# Patient Record
Sex: Female | Born: 1945 | Race: White | Hispanic: No | Marital: Married | Smoking: Never smoker
Health system: Southern US, Community
[De-identification: ages and names within clinical notes are randomized; demographics above are authoritative.]

## PROBLEM LIST (undated history)

## (undated) DIAGNOSIS — Z85528 Personal history of other malignant neoplasm of kidney: Secondary | ICD-10-CM

## (undated) DIAGNOSIS — K439 Ventral hernia without obstruction or gangrene: Secondary | ICD-10-CM

## (undated) DIAGNOSIS — C4491 Basal cell carcinoma of skin, unspecified: Secondary | ICD-10-CM

## (undated) DIAGNOSIS — I471 Supraventricular tachycardia, unspecified: Secondary | ICD-10-CM

## (undated) DIAGNOSIS — N281 Cyst of kidney, acquired: Secondary | ICD-10-CM

## (undated) DIAGNOSIS — I10 Essential (primary) hypertension: Secondary | ICD-10-CM

## (undated) DIAGNOSIS — I9789 Other postprocedural complications and disorders of the circulatory system, not elsewhere classified: Secondary | ICD-10-CM

## (undated) DIAGNOSIS — C439 Malignant melanoma of skin, unspecified: Secondary | ICD-10-CM

## (undated) DIAGNOSIS — Q625 Duplication of ureter: Secondary | ICD-10-CM

## (undated) DIAGNOSIS — I4891 Unspecified atrial fibrillation: Secondary | ICD-10-CM

## (undated) DIAGNOSIS — Z85820 Personal history of malignant melanoma of skin: Secondary | ICD-10-CM

## (undated) DIAGNOSIS — Z860100 Personal history of colon polyps, unspecified: Secondary | ICD-10-CM

## (undated) DIAGNOSIS — K219 Gastro-esophageal reflux disease without esophagitis: Secondary | ICD-10-CM

## (undated) DIAGNOSIS — E119 Type 2 diabetes mellitus without complications: Secondary | ICD-10-CM

## (undated) DIAGNOSIS — E785 Hyperlipidemia, unspecified: Secondary | ICD-10-CM

## (undated) DIAGNOSIS — C661 Malignant neoplasm of right ureter: Secondary | ICD-10-CM

## (undated) DIAGNOSIS — Z8719 Personal history of other diseases of the digestive system: Secondary | ICD-10-CM

## (undated) DIAGNOSIS — D3A8 Other benign neuroendocrine tumors: Secondary | ICD-10-CM

## (undated) DIAGNOSIS — K449 Diaphragmatic hernia without obstruction or gangrene: Secondary | ICD-10-CM

## (undated) DIAGNOSIS — Z8679 Personal history of other diseases of the circulatory system: Secondary | ICD-10-CM

## (undated) DIAGNOSIS — Z9889 Other specified postprocedural states: Secondary | ICD-10-CM

## (undated) DIAGNOSIS — Z85828 Personal history of other malignant neoplasm of skin: Secondary | ICD-10-CM

## (undated) DIAGNOSIS — Z8589 Personal history of malignant neoplasm of other organs and systems: Secondary | ICD-10-CM

## (undated) HISTORY — PX: COLONOSCOPY: SHX174

## (undated) HISTORY — PX: PANCREATICODUODENECTOMY: SUR1000

## (undated) HISTORY — DX: Supraventricular tachycardia, unspecified: I47.10

## (undated) HISTORY — DX: Other benign neuroendocrine tumors: D3A.8

## (undated) HISTORY — DX: Malignant neoplasm of right ureter: C66.1

## (undated) HISTORY — DX: Basal cell carcinoma of skin, unspecified: C44.91

## (undated) HISTORY — DX: Personal history of other malignant neoplasm of kidney: Z85.528

## (undated) HISTORY — DX: Supraventricular tachycardia: I47.1

## (undated) HISTORY — DX: Unspecified atrial fibrillation: I48.91

## (undated) HISTORY — DX: Type 2 diabetes mellitus without complications: E11.9

## (undated) HISTORY — DX: Malignant melanoma of skin, unspecified: C43.9

## (undated) HISTORY — DX: Other postprocedural complications and disorders of the circulatory system, not elsewhere classified: I97.89

## (undated) HISTORY — DX: Essential (primary) hypertension: I10

## (undated) HISTORY — PX: KIDNEY SURGERY: SHX687

---

## 1987-08-20 HISTORY — PX: MELANOMA EXCISION: SHX5266

## 1998-08-19 HISTORY — PX: LUMBAR DISC SURGERY: SHX700

## 1998-10-24 ENCOUNTER — Observation Stay (HOSPITAL_COMMUNITY): Admission: RE | Admit: 1998-10-24 | Discharge: 1998-10-24 | Payer: Self-pay | Admitting: Neurosurgery

## 1998-10-24 ENCOUNTER — Encounter: Payer: Self-pay | Admitting: Neurosurgery

## 1999-03-28 ENCOUNTER — Other Ambulatory Visit: Admission: RE | Admit: 1999-03-28 | Discharge: 1999-03-28 | Payer: Self-pay | Admitting: Obstetrics and Gynecology

## 1999-05-08 ENCOUNTER — Other Ambulatory Visit: Admission: RE | Admit: 1999-05-08 | Discharge: 1999-05-08 | Payer: Self-pay | Admitting: Obstetrics and Gynecology

## 1999-05-08 ENCOUNTER — Encounter (INDEPENDENT_AMBULATORY_CARE_PROVIDER_SITE_OTHER): Payer: Self-pay | Admitting: Specialist

## 2000-06-27 ENCOUNTER — Other Ambulatory Visit: Admission: RE | Admit: 2000-06-27 | Discharge: 2000-06-27 | Payer: Self-pay | Admitting: Obstetrics and Gynecology

## 2000-06-27 ENCOUNTER — Encounter (INDEPENDENT_AMBULATORY_CARE_PROVIDER_SITE_OTHER): Payer: Self-pay

## 2003-02-09 ENCOUNTER — Ambulatory Visit (HOSPITAL_COMMUNITY): Admission: RE | Admit: 2003-02-09 | Discharge: 2003-02-09 | Payer: Self-pay | Admitting: Gastroenterology

## 2004-06-14 ENCOUNTER — Other Ambulatory Visit: Admission: RE | Admit: 2004-06-14 | Discharge: 2004-06-14 | Payer: Self-pay | Admitting: Obstetrics and Gynecology

## 2005-02-01 ENCOUNTER — Ambulatory Visit (HOSPITAL_COMMUNITY): Admission: RE | Admit: 2005-02-01 | Discharge: 2005-02-01 | Payer: Self-pay | Admitting: Orthopedic Surgery

## 2005-02-01 ENCOUNTER — Ambulatory Visit (HOSPITAL_BASED_OUTPATIENT_CLINIC_OR_DEPARTMENT_OTHER): Admission: RE | Admit: 2005-02-01 | Discharge: 2005-02-01 | Payer: Self-pay | Admitting: Orthopedic Surgery

## 2005-02-01 HISTORY — PX: OTHER SURGICAL HISTORY: SHX169

## 2005-07-04 ENCOUNTER — Other Ambulatory Visit: Admission: RE | Admit: 2005-07-04 | Discharge: 2005-07-04 | Payer: Self-pay | Admitting: Obstetrics and Gynecology

## 2006-07-07 ENCOUNTER — Other Ambulatory Visit: Admission: RE | Admit: 2006-07-07 | Discharge: 2006-07-07 | Payer: Self-pay | Admitting: Obstetrics and Gynecology

## 2007-07-13 ENCOUNTER — Other Ambulatory Visit: Admission: RE | Admit: 2007-07-13 | Discharge: 2007-07-13 | Payer: Self-pay | Admitting: Obstetrics and Gynecology

## 2008-08-19 HISTORY — PX: CATARACT EXTRACTION W/ INTRAOCULAR LENS  IMPLANT, BILATERAL: SHX1307

## 2008-09-22 ENCOUNTER — Other Ambulatory Visit: Admission: RE | Admit: 2008-09-22 | Discharge: 2008-09-22 | Payer: Self-pay | Admitting: Obstetrics and Gynecology

## 2009-03-22 ENCOUNTER — Encounter: Admission: RE | Admit: 2009-03-22 | Discharge: 2009-03-22 | Payer: Self-pay | Admitting: Family Medicine

## 2009-09-25 ENCOUNTER — Other Ambulatory Visit: Admission: RE | Admit: 2009-09-25 | Discharge: 2009-09-25 | Payer: Self-pay | Admitting: Obstetrics and Gynecology

## 2011-01-04 NOTE — Op Note (Signed)
   NAMEKAIDA, GAMES NO.:  [id number]   MEDICAL RECORD NO.:  [id number]                   PATIENT TYPE:  AMB   LOCATION:  ENDO                                 FACILITY:  [facility]   PHYSICIAN:  Graylin Shiver, M.D.                DATE OF BIRTH:  [birth year]   DATE OF PROCEDURE:  02/09/2003  DATE OF DISCHARGE:                                 OPERATIVE REPORT   PROCEDURE:  Colonoscopy.   SURGEON:  Graylin Shiver, M.D.   INDICATIONS:  Family history of colon cancer.   INFORMED CONSENT:  Informed consent was obtained.   PREMEDICATION:  Fentanyl 70 mcg IV, Versed 7 mg IV.   DESCRIPTION OF PROCEDURE:  With the patient in the left lateral decubitus  position, a rectal exam was performed, and no masses were felt.  The Olympus  colonoscope was inserted into the rectum and advanced around a very tortuous  colon the cecum.  Cecal landmarks were identified.  The cecum and ascending  colon were normal.  The transverse colon was normal.  The descending colon,  sigmoid and rectum were normal.  She tolerated the procedure well without  complications.   IMPRESSION:  Normal colonoscopy to the cecum.   PLAN:  I would recommend a followup colonoscopy in five years.                                               Graylin Shiver, M.D.    Germain Osgood  D:  02/09/2003  T:  02/10/2003  Job:  [id number]   cc:   C. Duane Lope, M.D.  [address]  [place]  [place] [address]  Fax: [phone]

## 2011-01-04 NOTE — Op Note (Signed)
NAMECARALEE, Jennifer Wall              ACCOUNT NO.:  [id number]   MEDICAL RECORD NO.:  [id number]          PATIENT TYPE:  AMB   LOCATION:  DSC                          FACILITY:  [facility]   PHYSICIAN:  Katy Fitch. Sypher, M.D. DATE OF BIRTH:  [birth year]   DATE OF PROCEDURE:  02/01/2005  DATE OF DISCHARGE:                                 OPERATIVE REPORT   PREOP DIAGNOSIS:  Locking right thumb stenosing tenosynovitis.   POSTOP DIAGNOSIS:  Locking right thumb stenosing tenosynovitis.   OPERATIONS:  Release of right thumb A1 pulley.   OPERATING SURGEON:  Josephine Igo, M.D.   ASSISTANT:  Molly Maduro Dasnoit PA-C.   ANESTHESIA:  Marcaine 0.25% and 2% lidocaine. Metacarpal head level block of  right thumb supplemented by IV sedation. Supervising anesthesiologist Dr.  Gelene Mink.   INDICATIONS:  Jennifer Wall is a 65 year old right-hand dominant woman who  presented for evaluation of a locking right thumb. Physical examination  demonstrated a swollen right thumb A1 pulley and active locking of the IP  joint.   Due to a failed respond to nonoperative measures, she is brought to the  operating room at this time for release of a right thumb A1 pulley.   PROCEDURE IN DETAIL:  [name] is brought to the operating room and  placed in the supine position on the operating table.   Following light sedation, the right arm was prepped with Betadine soap  solution and sterilely draped. A pneumatic tourniquet was applied to the  proximal brachium.   Following exsanguination of the right arm with an Esmarch bandage, the  arterial tourniquet was inflated to 220 mmHg. Marcaine 0.25% and 2%  lidocaine were infiltrated at the metacarpal head level to obtain a digital  block. When anesthesia was satisfactory, a short transverse incision was  fashioned directly over the swollen A1 pulley. The subcutaneous tissues were  carefully divided taking care to identify the radial proper digital nerve.  This was gently  retracted. The pulley was isolated and split with scalpel  and scissors. Thereafter, full range of motion of the IP joint was  recovered.   The wound was repaired with mattress sutures of 5-0 nylon.  There were no  apparent complications.   For aftercare, [name] is provided Darvocet-N 100, one p.o. q.4-6 hours  p.r.n. pain. She will return to our office for followup in one week.       RVS/MEDQ  D:  02/01/2005  T:  02/01/2005  Job:  [id number]

## 2011-10-16 ENCOUNTER — Other Ambulatory Visit: Payer: Self-pay | Admitting: Dermatology

## 2011-11-29 ENCOUNTER — Other Ambulatory Visit: Payer: Self-pay | Admitting: Dermatology

## 2015-02-21 ENCOUNTER — Other Ambulatory Visit: Payer: Self-pay | Admitting: Gastroenterology

## 2015-02-21 ENCOUNTER — Encounter (HOSPITAL_COMMUNITY): Payer: Self-pay | Admitting: *Deleted

## 2015-02-22 ENCOUNTER — Ambulatory Visit (HOSPITAL_COMMUNITY): Payer: Medicare Other | Admitting: Anesthesiology

## 2015-02-22 ENCOUNTER — Ambulatory Visit (HOSPITAL_COMMUNITY)
Admission: RE | Admit: 2015-02-22 | Discharge: 2015-02-22 | Disposition: A | Discharge status: Home / Self Care | Payer: Medicare Other | Source: Ambulatory Visit | Attending: Gastroenterology | Admitting: Gastroenterology

## 2015-02-22 ENCOUNTER — Encounter (HOSPITAL_COMMUNITY): Payer: Self-pay | Admitting: Anesthesiology

## 2015-02-22 ENCOUNTER — Encounter (HOSPITAL_COMMUNITY)
Admission: RE | Disposition: A | Discharge status: Home / Self Care | Payer: Self-pay | Source: Ambulatory Visit | Attending: Gastroenterology

## 2015-02-22 DIAGNOSIS — K219 Gastro-esophageal reflux disease without esophagitis: Secondary | ICD-10-CM | POA: Insufficient documentation

## 2015-02-22 DIAGNOSIS — K868 Other specified diseases of pancreas: Secondary | ICD-10-CM | POA: Diagnosis present

## 2015-02-22 DIAGNOSIS — Z79899 Other long term (current) drug therapy: Secondary | ICD-10-CM | POA: Insufficient documentation

## 2015-02-22 DIAGNOSIS — I10 Essential (primary) hypertension: Secondary | ICD-10-CM | POA: Diagnosis not present

## 2015-02-22 HISTORY — DX: Gastro-esophageal reflux disease without esophagitis: K21.9

## 2015-02-22 HISTORY — PX: EUS: SHX5427

## 2015-02-22 HISTORY — DX: Essential (primary) hypertension: I10

## 2015-02-22 HISTORY — DX: Hyperlipidemia, unspecified: E78.5

## 2015-02-22 SURGERY — UPPER ENDOSCOPIC ULTRASOUND (EUS) RADIAL
Anesthesia: Monitor Anesthesia Care

## 2015-02-22 MED ORDER — LIDOCAINE HCL (CARDIAC) 20 MG/ML IV SOLN
INTRAVENOUS | Status: AC
Start: 2015-02-22 — End: 2015-02-22
  Filled 2015-02-22: qty 5

## 2015-02-22 MED ORDER — PROPOFOL INFUSION 10 MG/ML OPTIME
INTRAVENOUS | Status: DC | PRN
  Administered 2015-02-22: 2000 ug/kg/min via INTRAVENOUS

## 2015-02-22 MED ORDER — PROPOFOL INFUSION 10 MG/ML OPTIME
INTRAVENOUS | Status: DC | PRN
  Administered 2015-02-22: 90 mL via INTRAVENOUS

## 2015-02-22 MED ORDER — LACTATED RINGERS IV SOLN
INTRAVENOUS | Status: DC
  Administered 2015-02-22: 10:00:00 via INTRAVENOUS
  Administered 2015-02-22: 1000 mL via INTRAVENOUS

## 2015-02-22 MED ORDER — PROPOFOL 10 MG/ML IV BOLUS
INTRAVENOUS | Status: AC
  Filled 2015-02-22: qty 60

## 2015-02-22 MED ORDER — SODIUM CHLORIDE 0.9 % IV SOLN: INTRAVENOUS | Status: DC

## 2015-02-22 NOTE — Anesthesia Postprocedure Evaluation (Signed)
  Anesthesia Post-op Note  Patient: Jennifer Wall  Procedure(s) Performed: Procedure(s) (LRB): UPPER ENDOSCOPIC ULTRASOUND (EUS) RADIAL (N/A)  Patient Location: PACU  Anesthesia Type: MAC  Level of Consciousness: awake and alert   Airway and Oxygen Therapy: Patient Spontanous Breathing  Post-op Pain: mild  Post-op Assessment: Post-op Vital signs reviewed, Patient's Cardiovascular Status Stable, Respiratory Function Stable, Patent Airway and No signs of Nausea or vomiting  Last Vitals:  Filed Vitals:   02/22/15 1110  BP: 175/73  Pulse: 44  Temp:   Resp: 19    Post-op Vital Signs: stable   Complications: No apparent anesthesia complications

## 2015-02-22 NOTE — Anesthesia Preprocedure Evaluation (Addendum)
Anesthesia Evaluation  Patient identified by MRN, date of birth, ID band Patient awake    Reviewed: Allergy & Precautions, NPO status , Patient's Chart, lab work & pertinent test results  Airway Mallampati: II  TM Distance: >3 FB Neck ROM: Full    Dental no notable dental hx.    Pulmonary neg pulmonary ROS,  breath sounds clear to auscultation  Pulmonary exam normal       Cardiovascular Exercise Tolerance: Good hypertension, Pt. on medications Normal cardiovascular exam+ Valvular Problems/Murmurs Rhythm:Regular Rate:Normal     Neuro/Psych negative neurological ROS  negative psych ROS   GI/Hepatic Neg liver ROS, GERD-  Controlled,  Endo/Other  negative endocrine ROS  Renal/GU negative Renal ROS  negative genitourinary   Musculoskeletal negative musculoskeletal ROS (+)   Abdominal   Peds negative pediatric ROS (+)  Hematology negative hematology ROS (+)   Anesthesia Other Findings   Reproductive/Obstetrics negative OB ROS                            Anesthesia Physical Anesthesia Plan  ASA: II  Anesthesia Plan: MAC   Post-op Pain Management:    Induction: Intravenous  Airway Management Planned: Natural Airway  Additional Equipment:   Intra-op Plan:   Post-operative Plan:   Informed Consent: I have reviewed the patients History and Physical, chart, labs and discussed the procedure including the risks, benefits and alternatives for the proposed anesthesia with the patient or authorized representative who has indicated his/her understanding and acceptance.   Dental advisory given  Plan Discussed with: CRNA  Anesthesia Plan Comments:         Anesthesia Quick Evaluation

## 2015-02-22 NOTE — Discharge Instructions (Signed)
Endoscopic Ultrasound ° °Care After °Please read the instructions outlined below and refer to this sheet in the next few weeks. These discharge instructions provide you with general information on caring for yourself after you leave the hospital. Your doctor may also give you specific instructions. While your treatment has been planned according to the most current medical practices available, unavoidable complications occasionally occur. If you have any problems or questions after discharge, please call Dr. Kirston Luty ([facility]) at [phone]. ° °HOME CARE INSTRUCTIONS °Activity °· You may resume your regular activity but move at a slower pace for the next 24 hours.  °· Take frequent rest periods for the next 24 hours.  °· Walking will help expel (get rid of) the air and reduce the bloated feeling in your abdomen.  °· No driving for 24 hours (because of the anesthesia (medicine) used during the test).  °· You may shower.  °· Do not sign any important legal documents or operate any machinery for 24 hours (because of the anesthesia used during the test).  °Nutrition °· Drink plenty of fluids.  °· You may resume your normal diet.  °· Begin with a light meal and progress to your normal diet.  °· Avoid alcoholic beverages for 24 hours or as instructed by your caregiver.  °Medications °You may resume your normal medications unless your caregiver tells you otherwise. °What you can expect today °· You may experience abdominal discomfort such as a feeling of fullness or "gas" pains.  °· You may experience a sore throat for 2 to 3 days. This is normal. Gargling with salt water may help this.  °·  °SEEK IMMEDIATE MEDICAL CARE IF: °· You have excessive nausea (feeling sick to your stomach) and/or vomiting.  °· You have severe abdominal pain and distention (swelling).  °· You have trouble swallowing.  °· You have a temperature over 100° F (37.8° C).  °· You have rectal bleeding or vomiting of blood.  °Document  Released: 03/19/2004 Document Revised: 04/17/2011 Document Reviewed: 09/30/2007 °ExitCare® Patient Information ©2012 [employer]. °

## 2015-02-22 NOTE — Op Note (Signed)
[facility] [place], [id number]   UPPER ENDOSCOPIC ULTRASOUND PROCEDURE REPORT     EXAM DATE: 02/22/2015  PATIENT NAME:          Jennifer Wall, Jennifer Wall          MR#: [id number] BIRTHDATE:       1945-12-27     VISIT #:     [id number] ATTENDING:     Arta Silence, MD     STATUS:     outpatient ASSISTANT:      Sharon Mt and Carolynn Comment  REFERRING MD:  Melinda Crutch, M.D.  Acquanetta Sit, M.D. ASA CLASS:        Class II  INDICATIONS:  The patient is a 69 yr old female here for a lower endoscopic ultrasound due to abnormal CT scan; lesion in head of pancreas. PROCEDURE PERFORMED:     Upper Endoscopic Ultrasound with FNA   MEDICATIONS:     Monitored anesthesia care (MAC) via CRNA   CONSENT: The patient understands the risks and benefits of the procedure and understands that these risks include, but are not limited to: sedation, allergic reaction, infection, perforation and/or bleeding. Alternative means of evaluation and treatment include, among others: physical exam, x-rays, and/or surgical intervention. The patient elects to proceed with this endoscopic procedure.  DESCRIPTION OF PROCEDURE: During intra-op preparation period all mechanical & medical equipment was checked for proper function. Hand hygiene and appropriate measures for infection prevention was taken. After the risks, benefits and alternatives of the procedure were thoroughly explained, Informed consent was verified, confirmed and timeout was successfully executed by the treatment team. The patient was then placed in the left, lateral, decubitus position and IV sedation was administered. Throughout the procedure, the patients blood pressure, pulse and oxygen saturations were monitored continuously. Under direct visualization, the linear oblique-viewing echoendoscope was introduced through the mouth and advanced to the bulb of duodenum.   Round well-defined hypoechoic 15 mm x  20 mm lesion seen in head of pancreas; lesion is in close proximity to, and seems to abut (in part) but not invade, the portal vein along a 67mm distance.  No perilesional adenopathy.  No involvement noted in celiac artery or SMA vasculature.  Lesion was biopsied x 4 (25g needle), 3 passes for slides and 1 pass in toto for cell block.     ADVERSE EVENTS:     None IMPRESSIONS:     As above.  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  RECOMMENDATIONS:     1.  Watch for potential complications of procedure. 2.  Await cytology results. 3.  If neuroendocrine lesion is confirmed, would obtain surgical consultation.  ___________________________________ Arta Silence, MD eSigned:  Arta Silence, MD 02/22/2015 10:46 AM   cc:

## 2015-02-22 NOTE — H&P (Signed)
Patient interval history reviewed.  Patient examined again.  There has been no change from documented H/P dated 02/16/15 (scanned into chart from our office) except as documented above.  Assessment:  1.  Pancreatic head lesion. 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  Plan:  1.  Endoscopic ultrasound with possible biopsies (fine needle aspiration = FNA). 2.  Risks (bleeding, infection, bowel perforation that could require surgery, sedation-related changes in cardiopulmonary systems), benefits (identification and possible treatment of source of symptoms, exclusion of certain causes of symptoms), and alternatives (watchful waiting, radiographic imaging studies, empiric medical treatment) of upper endoscopy with ultrasound and possible biopsies (EUS +/- FNA) were explained to patient/family in detail and patient wishes to proceed.

## 2015-02-22 NOTE — Transfer of Care (Signed)
Immediate Anesthesia Transfer of Care Note  Patient: Jennifer Wall  Procedure(s) Performed: Procedure(s): UPPER ENDOSCOPIC ULTRASOUND (EUS) RADIAL (N/A)  Patient Location: PACU  Anesthesia Type:MAC  Level of Consciousness: awake, alert  and oriented  Airway & Oxygen Therapy: Patient Spontanous Breathing and Patient connected to nasal cannula oxygen  Post-op Assessment: Report given to RN and Post -op Vital signs reviewed and stable  Post vital signs: Reviewed and stable  Last Vitals:  Filed Vitals:   02/22/15 0937  BP: 170/71  Pulse: 57  Temp: 36.6 C  Resp: 15    Complications: No apparent anesthesia complications

## 2015-02-22 NOTE — Addendum Note (Signed)
Addended by: Sederick Jacobsen on: 02/22/2015 08:41 AM   Modules accepted: Orders  

## 2015-02-23 ENCOUNTER — Encounter (HOSPITAL_COMMUNITY): Payer: Self-pay | Admitting: Gastroenterology

## 2015-03-20 DIAGNOSIS — D3A8 Other benign neuroendocrine tumors: Secondary | ICD-10-CM

## 2015-03-20 HISTORY — DX: Other benign neuroendocrine tumors: D3A.8

## 2015-03-23 DIAGNOSIS — I119 Hypertensive heart disease without heart failure: Secondary | ICD-10-CM | POA: Insufficient documentation

## 2015-03-23 DIAGNOSIS — K219 Gastro-esophageal reflux disease without esophagitis: Secondary | ICD-10-CM | POA: Insufficient documentation

## 2015-03-23 DIAGNOSIS — I10 Essential (primary) hypertension: Secondary | ICD-10-CM

## 2015-03-23 HISTORY — DX: Essential (primary) hypertension: I10

## 2015-04-14 HISTORY — PX: DIAGNOSTIC LAPAROSCOPY: SUR761

## 2015-04-17 DIAGNOSIS — C4491 Basal cell carcinoma of skin, unspecified: Secondary | ICD-10-CM

## 2015-04-17 DIAGNOSIS — C439 Malignant melanoma of skin, unspecified: Secondary | ICD-10-CM

## 2015-04-17 HISTORY — DX: Malignant melanoma of skin, unspecified: C43.9

## 2015-04-17 HISTORY — DX: Basal cell carcinoma of skin, unspecified: C44.91

## 2015-05-22 DIAGNOSIS — I480 Paroxysmal atrial fibrillation: Secondary | ICD-10-CM | POA: Insufficient documentation

## 2015-05-22 DIAGNOSIS — I4891 Unspecified atrial fibrillation: Secondary | ICD-10-CM

## 2015-05-22 HISTORY — DX: Unspecified atrial fibrillation: I48.91

## 2015-06-12 DIAGNOSIS — I471 Supraventricular tachycardia, unspecified: Secondary | ICD-10-CM

## 2015-06-12 HISTORY — DX: Supraventricular tachycardia: I47.1

## 2015-06-12 HISTORY — DX: Supraventricular tachycardia, unspecified: I47.10

## 2015-09-13 ENCOUNTER — Other Ambulatory Visit: Payer: Self-pay | Admitting: Urology

## 2015-09-15 ENCOUNTER — Encounter (HOSPITAL_BASED_OUTPATIENT_CLINIC_OR_DEPARTMENT_OTHER): Payer: Self-pay | Admitting: *Deleted

## 2015-09-18 ENCOUNTER — Encounter (HOSPITAL_BASED_OUTPATIENT_CLINIC_OR_DEPARTMENT_OTHER): Payer: Self-pay | Admitting: *Deleted

## 2015-09-18 NOTE — Progress Notes (Signed)
NPO AFTER MN.  ARRIVE AT 0745.  NEEDS ISTAT AND EKG.  WILL TAKE TOPROL AND PROTONIX AM DOS W/ SIPS OF WATER.

## 2015-09-21 NOTE — Anesthesia Preprocedure Evaluation (Addendum)
Anesthesia Evaluation  Patient identified by MRN, date of birth, ID band Patient awake    Reviewed: Allergy & Precautions, NPO status , Patient's Chart, lab work & pertinent test results  Airway Mallampati: I  TM Distance: >3 FB Neck ROM: Full    Dental no notable dental hx.    Pulmonary neg pulmonary ROS,    Pulmonary exam normal breath sounds clear to auscultation       Cardiovascular Exercise Tolerance: Good hypertension, Pt. on medications Normal cardiovascular exam+ Valvular Problems/Murmurs  Rhythm:Regular Rate:Normal     Neuro/Psych negative neurological ROS  negative psych ROS   GI/Hepatic Neg liver ROS, GERD  Controlled,  Endo/Other  negative endocrine ROS  Renal/GU negative Renal ROS  negative genitourinary   Musculoskeletal negative musculoskeletal ROS (+)   Abdominal   Peds negative pediatric ROS (+)  Hematology negative hematology ROS (+)   Anesthesia Other Findings Denies cardiac or pulmonary symtpoms, EKG in sinus today, NPO appropriate  Reproductive/Obstetrics negative OB ROS                           Anesthesia Physical  Anesthesia Plan  ASA: II  Anesthesia Plan: General   Post-op Pain Management:    Induction: Intravenous  Airway Management Planned: LMA  Additional Equipment:   Intra-op Plan:   Post-operative Plan: Extubation in OR  Informed Consent: I have reviewed the patients History and Physical, chart, labs and discussed the procedure including the risks, benefits and alternatives for the proposed anesthesia with the patient or authorized representative who has indicated his/her understanding and acceptance.   Dental advisory given  Plan Discussed with: CRNA  Anesthesia Plan Comments:        Anesthesia Quick Evaluation

## 2015-09-22 ENCOUNTER — Ambulatory Visit (HOSPITAL_BASED_OUTPATIENT_CLINIC_OR_DEPARTMENT_OTHER): Payer: Medicare Other | Admitting: Anesthesiology

## 2015-09-22 ENCOUNTER — Encounter (HOSPITAL_BASED_OUTPATIENT_CLINIC_OR_DEPARTMENT_OTHER): Payer: Self-pay | Admitting: *Deleted

## 2015-09-22 ENCOUNTER — Other Ambulatory Visit: Payer: Self-pay

## 2015-09-22 ENCOUNTER — Ambulatory Visit (HOSPITAL_BASED_OUTPATIENT_CLINIC_OR_DEPARTMENT_OTHER)
Admission: RE | Admit: 2015-09-22 | Discharge: 2015-09-22 | Disposition: A | Discharge status: Home / Self Care | Payer: Medicare Other | Source: Ambulatory Visit | Attending: Urology | Admitting: Urology

## 2015-09-22 ENCOUNTER — Encounter (HOSPITAL_BASED_OUTPATIENT_CLINIC_OR_DEPARTMENT_OTHER)
Admission: RE | Disposition: A | Discharge status: Home / Self Care | Payer: Self-pay | Source: Ambulatory Visit | Attending: Urology

## 2015-09-22 DIAGNOSIS — E7800 Pure hypercholesterolemia, unspecified: Secondary | ICD-10-CM | POA: Insufficient documentation

## 2015-09-22 DIAGNOSIS — Z79899 Other long term (current) drug therapy: Secondary | ICD-10-CM | POA: Insufficient documentation

## 2015-09-22 DIAGNOSIS — C651 Malignant neoplasm of right renal pelvis: Secondary | ICD-10-CM | POA: Diagnosis not present

## 2015-09-22 DIAGNOSIS — Z85820 Personal history of malignant melanoma of skin: Secondary | ICD-10-CM | POA: Insufficient documentation

## 2015-09-22 DIAGNOSIS — Z85828 Personal history of other malignant neoplasm of skin: Secondary | ICD-10-CM | POA: Diagnosis not present

## 2015-09-22 DIAGNOSIS — E785 Hyperlipidemia, unspecified: Secondary | ICD-10-CM | POA: Insufficient documentation

## 2015-09-22 DIAGNOSIS — Z7982 Long term (current) use of aspirin: Secondary | ICD-10-CM | POA: Diagnosis not present

## 2015-09-22 DIAGNOSIS — R310 Gross hematuria: Secondary | ICD-10-CM | POA: Diagnosis present

## 2015-09-22 DIAGNOSIS — Q625 Duplication of ureter: Secondary | ICD-10-CM | POA: Insufficient documentation

## 2015-09-22 DIAGNOSIS — R001 Bradycardia, unspecified: Secondary | ICD-10-CM | POA: Diagnosis not present

## 2015-09-22 DIAGNOSIS — N281 Cyst of kidney, acquired: Secondary | ICD-10-CM | POA: Diagnosis not present

## 2015-09-22 DIAGNOSIS — K219 Gastro-esophageal reflux disease without esophagitis: Secondary | ICD-10-CM | POA: Insufficient documentation

## 2015-09-22 DIAGNOSIS — I10 Essential (primary) hypertension: Secondary | ICD-10-CM | POA: Diagnosis not present

## 2015-09-22 DIAGNOSIS — E559 Vitamin D deficiency, unspecified: Secondary | ICD-10-CM | POA: Insufficient documentation

## 2015-09-22 HISTORY — DX: Other specified postprocedural states: Z85.820

## 2015-09-22 HISTORY — DX: Personal history of other diseases of the circulatory system: Z86.79

## 2015-09-22 HISTORY — DX: Personal history of other malignant neoplasm of skin: Z85.828

## 2015-09-22 HISTORY — DX: Personal history of other malignant neoplasm of skin: Z98.890

## 2015-09-22 HISTORY — DX: Personal history of colonic polyps: Z86.010

## 2015-09-22 HISTORY — DX: Personal history of colon polyps, unspecified: Z86.0100

## 2015-09-22 HISTORY — DX: Diaphragmatic hernia without obstruction or gangrene: K44.9

## 2015-09-22 HISTORY — PX: CYSTOSCOPY WITH RETROGRADE PYELOGRAM, URETEROSCOPY AND STENT PLACEMENT: SHX5789

## 2015-09-22 LAB — POCT I-STAT, CHEM 8
BUN: 16 mg/dL (ref 6–20)
Calcium, Ion: 1.27 mmol/L (ref 1.13–1.30)
Chloride: 102 mmol/L (ref 101–111)
Creatinine, Ser: 0.5 mg/dL (ref 0.44–1.00)
Glucose, Bld: 146 mg/dL — ABNORMAL HIGH (ref 65–99)
HCT: 43 % (ref 36.0–46.0)
Hemoglobin: 14.6 g/dL (ref 12.0–15.0)
Potassium: 3.8 mmol/L (ref 3.5–5.1)
Sodium: 143 mmol/L (ref 135–145)
TCO2: 25 mmol/L (ref 0–100)

## 2015-09-22 SURGERY — CYSTOURETEROSCOPY, WITH RETROGRADE PYELOGRAM AND STENT INSERTION
Anesthesia: General | Site: Renal | Laterality: Right

## 2015-09-22 MED ORDER — EPHEDRINE SULFATE 50 MG/ML IJ SOLN
INTRAMUSCULAR | Status: AC
  Filled 2015-09-22: qty 1

## 2015-09-22 MED ORDER — OXYBUTYNIN CHLORIDE 5 MG PO TABS
ORAL_TABLET | ORAL | Status: AC
  Filled 2015-09-22: qty 1

## 2015-09-22 MED ORDER — TAMSULOSIN HCL 0.4 MG PO CAPS
0.4000 mg | ORAL_CAPSULE | Freq: Once | ORAL | Status: DC
  Filled 2015-09-22: qty 1

## 2015-09-22 MED ORDER — PHENAZOPYRIDINE HCL 200 MG PO TABS: 200.0000 mg | ORAL_TABLET | Freq: Three times a day (TID) | ORAL | Status: DC | PRN

## 2015-09-22 MED ORDER — WHITE PETROLATUM GEL
Status: AC
  Filled 2015-09-22: qty 5

## 2015-09-22 MED ORDER — ONDANSETRON HCL 4 MG/2ML IJ SOLN
INTRAMUSCULAR | Status: AC
  Filled 2015-09-22: qty 2

## 2015-09-22 MED ORDER — STERILE WATER FOR IRRIGATION IR SOLN
Status: DC | PRN
  Administered 2015-09-22: 3000 mL

## 2015-09-22 MED ORDER — PROPOFOL 10 MG/ML IV BOLUS
INTRAVENOUS | Status: AC
  Filled 2015-09-22: qty 20

## 2015-09-22 MED ORDER — MIDAZOLAM HCL 5 MG/5ML IJ SOLN
INTRAMUSCULAR | Status: DC | PRN
  Administered 2015-09-22: 1 mg via INTRAVENOUS

## 2015-09-22 MED ORDER — IOHEXOL 350 MG/ML SOLN
INTRAVENOUS | Status: DC | PRN
  Administered 2015-09-22: 20 mL

## 2015-09-22 MED ORDER — PHENAZOPYRIDINE HCL 200 MG PO TABS
200.0000 mg | ORAL_TABLET | Freq: Once | ORAL | Status: AC
  Administered 2015-09-22: 200 mg via ORAL
  Filled 2015-09-22: qty 1

## 2015-09-22 MED ORDER — LACTATED RINGERS IV SOLN
INTRAVENOUS | Status: DC
  Administered 2015-09-22 (×2): via INTRAVENOUS
  Filled 2015-09-22: qty 1000

## 2015-09-22 MED ORDER — SODIUM CHLORIDE 0.9 % IR SOLN
Status: DC | PRN
  Administered 2015-09-22: 3000 mL via INTRAVESICAL

## 2015-09-22 MED ORDER — CIPROFLOXACIN IN D5W 400 MG/200ML IV SOLN
400.0000 mg | INTRAVENOUS | Status: AC
  Administered 2015-09-22: 400 mg via INTRAVENOUS
  Filled 2015-09-22: qty 200

## 2015-09-22 MED ORDER — LIDOCAINE HCL (CARDIAC) 20 MG/ML IV SOLN
INTRAVENOUS | Status: DC | PRN
  Administered 2015-09-22: 60 mg via INTRAVENOUS

## 2015-09-22 MED ORDER — DEXAMETHASONE SODIUM PHOSPHATE 10 MG/ML IJ SOLN
INTRAMUSCULAR | Status: AC
  Filled 2015-09-22: qty 1

## 2015-09-22 MED ORDER — HYDROCODONE-ACETAMINOPHEN 10-325 MG PO TABS: 1.0000 | ORAL_TABLET | ORAL | Status: DC | PRN

## 2015-09-22 MED ORDER — CIPROFLOXACIN IN D5W 400 MG/200ML IV SOLN
INTRAVENOUS | Status: AC
  Filled 2015-09-22: qty 200

## 2015-09-22 MED ORDER — ONDANSETRON HCL 4 MG/2ML IJ SOLN
INTRAMUSCULAR | Status: DC | PRN
  Administered 2015-09-22: 4 mg via INTRAVENOUS

## 2015-09-22 MED ORDER — FENTANYL CITRATE (PF) 100 MCG/2ML IJ SOLN
INTRAMUSCULAR | Status: AC
  Filled 2015-09-22: qty 2

## 2015-09-22 MED ORDER — FENTANYL CITRATE (PF) 100 MCG/2ML IJ SOLN
INTRAMUSCULAR | Status: DC | PRN
  Administered 2015-09-22: 50 ug via INTRAVENOUS

## 2015-09-22 MED ORDER — OXYBUTYNIN CHLORIDE 5 MG PO TABS
5.0000 mg | ORAL_TABLET | Freq: Once | ORAL | Status: AC
  Administered 2015-09-22: 5 mg via ORAL
  Filled 2015-09-22: qty 1

## 2015-09-22 MED ORDER — DEXAMETHASONE SODIUM PHOSPHATE 4 MG/ML IJ SOLN
INTRAMUSCULAR | Status: DC | PRN
  Administered 2015-09-22: 10 mg via INTRAVENOUS

## 2015-09-22 MED ORDER — EPHEDRINE SULFATE 50 MG/ML IJ SOLN
INTRAMUSCULAR | Status: DC | PRN
  Administered 2015-09-22: 10 mg via INTRAVENOUS

## 2015-09-22 MED ORDER — LIDOCAINE HCL (CARDIAC) 20 MG/ML IV SOLN
INTRAVENOUS | Status: AC
  Filled 2015-09-22: qty 5

## 2015-09-22 MED ORDER — ONDANSETRON HCL 4 MG/2ML IJ SOLN
4.0000 mg | Freq: Once | INTRAMUSCULAR | Status: DC | PRN
  Filled 2015-09-22: qty 2

## 2015-09-22 MED ORDER — PHENAZOPYRIDINE HCL 100 MG PO TABS
ORAL_TABLET | ORAL | Status: AC
  Filled 2015-09-22: qty 2

## 2015-09-22 MED ORDER — MIDAZOLAM HCL 2 MG/2ML IJ SOLN
INTRAMUSCULAR | Status: AC
  Filled 2015-09-22: qty 2

## 2015-09-22 MED ORDER — PROPOFOL 10 MG/ML IV BOLUS
INTRAVENOUS | Status: DC | PRN
  Administered 2015-09-22: 160 mg via INTRAVENOUS

## 2015-09-22 MED ORDER — KETOROLAC TROMETHAMINE 30 MG/ML IJ SOLN
INTRAMUSCULAR | Status: AC
  Filled 2015-09-22: qty 1

## 2015-09-22 MED ORDER — FENTANYL CITRATE (PF) 100 MCG/2ML IJ SOLN
25.0000 ug | INTRAMUSCULAR | Status: DC | PRN
  Filled 2015-09-22: qty 1

## 2015-09-22 SURGICAL SUPPLY — 44 items
BAG DRAIN URO-CYSTO SKYTR STRL (DRAIN) ×3 IMPLANT
BAG DRN UROCATH (DRAIN) ×2
BASKET LASER NITINOL 1.9FR (BASKET) IMPLANT
BASKET STNLS GEMINI 4WIRE 3FR (BASKET) IMPLANT
BASKET ZERO TIP NITINOL 2.4FR (BASKET) ×2 IMPLANT
BSKT STON RTRVL 120 1.9FR (BASKET)
BSKT STON RTRVL GEM 120X11 3FR (BASKET)
BSKT STON RTRVL ZERO TP 2.4FR (BASKET) ×2
CABLE HIGH FREQUENCY MONO STRZ (ELECTRODE) ×2 IMPLANT
CATH INTERMIT  6FR 70CM (CATHETERS) ×2 IMPLANT
CATH URET 5FR 28IN CONE TIP (BALLOONS)
CATH URET 5FR 70CM CONE TIP (BALLOONS) IMPLANT
CLOTH BEACON ORANGE TIMEOUT ST (SAFETY) ×3 IMPLANT
COAGULATING BALL ELECTRODE ×2 IMPLANT
ELECT COAG BALL END 3FR (ELECTROSURGICAL) ×3
ELECT REM PT RETURN 9FT ADLT (ELECTROSURGICAL) ×3
ELECTRODE COAG BALL END 3FR (ELECTROSURGICAL) ×1 IMPLANT
ELECTRODE REM PT RTRN 9FT ADLT (ELECTROSURGICAL) ×1 IMPLANT
FIBER LASER FLEXIVA 365 (UROLOGICAL SUPPLIES) IMPLANT
FIBER LASER FLEXIVA 550 (UROLOGICAL SUPPLIES) IMPLANT
FIBER LASER TRAC TIP (UROLOGICAL SUPPLIES) IMPLANT
GLOVE BIO SURGEON STRL SZ8 (GLOVE) ×3 IMPLANT
GOWN STRL REUS W/ TWL LRG LVL3 (GOWN DISPOSABLE) ×4 IMPLANT
GOWN STRL REUS W/ TWL XL LVL3 (GOWN DISPOSABLE) ×2 IMPLANT
GOWN STRL REUS W/TWL LRG LVL3 (GOWN DISPOSABLE) ×9
GOWN STRL REUS W/TWL XL LVL3 (GOWN DISPOSABLE) ×3
GUIDEWIRE 0.038 PTFE COATED (WIRE) IMPLANT
GUIDEWIRE ANG ZIPWIRE 038X150 (WIRE) IMPLANT
GUIDEWIRE STR DUAL SENSOR (WIRE) ×3 IMPLANT
HIGH FREQUENCY MONOPOLAR CABLE ×2 IMPLANT
IV NS 1000ML (IV SOLUTION) ×9
IV NS 1000ML BAXH (IV SOLUTION) ×3 IMPLANT
IV NS IRRIG 3000ML ARTHROMATIC (IV SOLUTION) ×2 IMPLANT
KIT BALLIN UROMAX 15FX10 (LABEL) IMPLANT
KIT BALLN UROMAX 15FX4 (MISCELLANEOUS) IMPLANT
KIT BALLN UROMAX 26 75X4 (MISCELLANEOUS)
KIT ROOM TURNOVER WOR (KITS) ×3 IMPLANT
MANIFOLD NEPTUNE II (INSTRUMENTS) ×2 IMPLANT
PACK CYSTO (CUSTOM PROCEDURE TRAY) ×3 IMPLANT
SET HIGH PRES BAL DIL (LABEL)
SHEATH ACCESS URETERAL 38CM (SHEATH) ×2 IMPLANT
STENT POLARIS 5FRX24 (STENTS) ×2 IMPLANT
TUBE CONNECTING 12X1/4 (SUCTIONS) ×2 IMPLANT
WATER STERILE IRR 3000ML UROMA (IV SOLUTION) ×2 IMPLANT

## 2015-09-22 NOTE — Op Note (Signed)
PATIENT:  Jennifer Wall  PRE-OPERATIVE DIAGNOSIS:  Right upper pole calyceal filling defect with gross hematuria  POST-OPERATIVE DIAGNOSIS: Right upper pole calyceal papillary tumor  PROCEDURE: 1. Cystoscopy with right retrograde pyelogram including interpretation 2. Right ureteroscopy with infundibular tumor biopsy 3. Fulguration of right upper pole infundibular tumor 4. Right ureteral stent placement  SURGEON:  Claybon Jabs  INDICATION: Jennifer Wall is a 70 year old female who underwent a previous hematuria workup in the past that was negative. She recently presented with intermittent gross hematuria without localizing flank pain. A CT scan revealed a partially duplicated right sided collecting system as well as a filling defect in the upper pole infundibulum. Cystoscopy revealed no abnormality of the bladder but blood was noted coming from the right ureteral orifice. 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  ANESTHESIA:  General  EBL:  Minimal  DRAINS: 5 French, 24 cm Polaris stent (with string)   LOCAL MEDICATIONS USED:  None  SPECIMEN:  Bladder tumor to pathology   Description of procedure: After informed consent the patient was taken to the operating room and placed on the table in a supine position. General anesthesia was then administered. Once fully anesthetized the patient was moved to the dorsal lithotomy position and the genitalia were sterilely prepped and draped in standard fashion. An official timeout was then performed.  Initially the 23 French cystoscope with 30 lens was passed into the bladder and the bladder was fully and systematically inspected. No tumors, stones or inflammatory lesions were identified. Ureteral orifices were noted to be of normal configuration and position. A right retrograde pyelogram was then performed.  Retrograde pyelography was performed  by passing a 6 [place] open-ended ureteral catheter through the cystoscope and into the right ureteral orifice. I then injected full-strength Omnipaque contrast through the open-ended catheter up the right ureter under direct fluoroscopy. This revealed a single distal ureter but about halfway up the ureter diverged into 2 separate ureters portending the upper and lower poles of the right kidney. There were no filling defects in the lower pole but in the upper pole the filling defect noted on CT scan was identified. It was in the lower of the 2 upper pole calyces.  A 0.038 inch floppy-tipped guidewire was then passed through the open ended catheter and I was able to negotiate it into the upper pole ureter. I left this in place and then passed a ureteral access sheath over the guidewire into the area of the renal pelvis and removed the inner portion of the access sheath as well as guidewire. The renal pelvis portending the upper pole was so small I did not feel that I would be able to adequately barbotaged this area and therefore elected to proceed with ureteroscopic evaluation. The 6 French flexible ureteroscope was then passed through the ureteral access sheath into the area of the upper pole and I first inspected the most cephalad calyx and noted no abnormality. The more caudad calyx however revealed an obvious papillary tumor. This was photographed. I then used the 0 tip nitinol basket to engage the tumor and was able, through multiple passes to remove the majority of the tumor with excellent specimen obtained for pathology. Reinspection revealed most of the tumor was gone. There were some irregular appearing areas near where the tumor had been attached to the wall of the calyx as well. I elected to proceed  with fulguration of this area as it was a small enough area that this could be undertaken with the Bugbee electrode.  I passed the Bugbee electrode through the ureteroscope and was able to fulgurate all  obvious tumor that had remained as well as fulgurating systematically the area surrounding where the tumor was located as the mucosa there appeared to be slightly irregular. There was little to no bleeding that occurred and at the end there was no bleeding noted with all visible tumor having been fulgurated. I therefore removed the Bugbee electrode and reinserted the guidewire in the area of the upper pole and then removed the ureteroscope as well as access sheath leaving the guidewire in place.  The cystoscope was backloaded over the guidewire and the Polaris stent was then passed into the area of the upper pole calyx. As I removed the guidewire there was good curl in the area of the upper pole renal pelvis although this area was quite small due to the fact that the system was duplicated. I remove the guidewire completely and noted the stent exiting the right ureteral orifice. The bladder was then drained and the cystoscope removed. The patient was awakened and taken to the recovery room in stable and satisfactory condition. She tolerated procedure well no intraoperative complications.  At this point I will await pathology results and make a determination as to further therapy based on those results however if it turns out that this is low grade she may be a good candidate for a second look and repeat fulguration with conservative management although this will be discussed further at her follow-up visit.  PLAN OF CARE: Discharge to home after PACU  PATIENT DISPOSITION:  PACU - hemodynamically stable.

## 2015-09-22 NOTE — Transfer of Care (Signed)
Immediate Anesthesia Transfer of Care Note  Patient: Jennifer Wall  Procedure(s) Performed: Procedure(s): CYSTOSCOPY WITH RIGHT RETROGRADE PYELOGRAM, URETEROSCOPY AND STENT PLACEMENT, BIOPSY (Right)  Patient Location: PACU  Anesthesia Type:General  Level of Consciousness: awake and alert   Airway & Oxygen Therapy: Patient connected to nasal cannula oxygen  Post-op Assessment: Report given to RN  Post vital signs: Reviewed  Last Vitals:  Filed Vitals:   09/22/15 0940 09/22/15 0945  BP:  146/68  Pulse: 66 61  Temp:    Resp: 15 16    Complications: No apparent anesthesia complications

## 2015-09-22 NOTE — H&P (Signed)
Mrs. Jennifer Wall is a 70 year old female with a history of gross hematuria.   History of Present Illness Gross hematuria: She experienced intermittent gross hematuria with no discomfort and no risk factors identified.  Workup in 6/16: CT scan, cystoscopy and cytology - negative.  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  I reviewed the CT scan images from [facility] which revealed a density in what appeared to be a cystocele. It appeared to be quite large and was seen on axial images only as this area was cut off on sagittal and coronal imaging.  Cystoscopy 8/16 - large cystocele, no bladder stones and no bladder lesions.    Bilateral renal cyst: These were incidentally noted on CT scan in 6/16.    Cystocele: She has moderate pelvic prolapse that actually could be seen on her CT scan. She is asymptomatic from this.     Interval history: She began experiencing gross hematuria again without localizing flank pain but did report some slight discomfort on the right hand side. Cystoscopy revealed what appeared to be slightly bloody urine coming from the right ureteral orifice although it was very faint. Since that time she has not seen any further gross hematuria.   Past Medical History Problems  1. History of basal cell carcinoma (Z85.828) 2. History of colonic polyps (Z86.010) 3. History of depression (Z86.59) 4. History of esophageal reflux (Z87.19) 5. History of hypercholesterolemia (Z86.39) 6. History of hyperlipidemia (Z86.39) 7. History of hypertension (Z86.79) 8. History of malignant melanoma (Z85.820) 9. History of osteopenia (Z87.39) 10. History of Primary malignant neuroendocrine neoplasm of duodenum (C7A.8) 11. History of Vitamin D deficiency (E55.9)  Surgical History Problems  1. History of Back Surgery 2. History of Radical Pancreaticoduodenectomy  Current Meds 1.  Aspirin 81 MG TABS;  Therapy: (Recorded:22Jun2016) to Recorded 2. Biotin CAPS;  Therapy: (Recorded:22Jun2016) to Recorded 3. Calcium + D TABS;  Therapy: (Recorded:22Jun2016) to Recorded 4. CeleXA 10 MG Oral Tablet;  Therapy: (Recorded:05Jan2017) to Recorded 5. Lisinopril 20 MG Oral Tablet;  Therapy: (Recorded:05Jan2017) to Recorded 6. Lovaza 1 GM Oral Capsule;  Therapy: (Recorded:22Jun2016) to Recorded 7. Magnesium CAPS;  Therapy: (Recorded:22Jun2016) to Recorded 8. Metoprolol Tartrate 50 MG Oral Tablet;  Therapy: (Recorded:05Jan2017) to Recorded 9. Multi-Vitamin TABS;  Therapy: (Recorded:22Jun2016) to Recorded 10. Pantoprazole Sodium 40 MG Oral Tablet Delayed Release;   Therapy: (Recorded:05Jan2017) to Recorded 11. Simvastatin 40 MG Oral Tablet;   Therapy: (Recorded:22Jun2016) to Recorded 12. Triamterene-HCTZ 37.5-25 MG Oral Tablet;   Therapy: (Recorded:22Jun2016) to Recorded 13. Vitamin C 500 MG Oral Tablet;   Therapy: (Recorded:22Jun2016) to Recorded 14. Vitamin D 50000 UNIT CAPS;   Therapy: (Recorded:22Jun2016) to Recorded  Allergies Medication  1. Erythromycin TABS 2. Tricor TABS  Family History Problems  1. Family history of Deceased : Mother, Father 2. Family history of hypertension (Z82.49) : Father 3. Family history of liver cancer (Z80.0) : Mother 4. Family history of renal failure (Z84.1) : Father  Social History Problems  1. Alcohol use (Z78.9)   ocassionally 2. Caffeine use (F15.90) 3. Married 4. Never a smoker 5. Two children     Review of Systems Genitourinary, constitutional, skin, eye, otolaryngeal, hematologic/lymphatic, cardiovascular, pulmonary, endocrine, musculoskeletal, gastrointestinal, neurological and psychiatric system(s) were reviewed and pertinent findings if present are noted and are otherwise negative.  Genitourinary: hematuria.  Musculoskeletal: back pain.   Vitals Vital Signs  Blood Pressure: 145 /  71 Heart Rate:  53 Height: 5 ft 2 in Weight: 148 lb  BMI Calculated: 27.07 BSA Calculated: 1.68 Temperature: 97 F  Physical Exam Constitutional: Well nourished and well developed . No acute distress.   ENT:. The ears and nose are normal in appearance.   Neck: The appearance of the neck is normal and no neck mass is present.   Pulmonary: No respiratory distress and normal respiratory rhythm and effort.   Cardiovascular: Heart rate and rhythm are normal . No peripheral edema.   Abdomen: The abdomen is soft and nontender. No masses are palpated. No CVA tenderness. No hernias are palpable. No hepatosplenomegaly noted.   Lymphatics: The femoral and inguinal nodes are not enlarged or tender.   Skin: Normal skin turgor, no visible rash and no visible skin lesions.   Neuro/Psych:. Mood and affect are appropriate.     Assessment  I went over the results of her evaluation which reveals normal renal function with a creatinine of 0.67.    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 Because of this finding I have recommended further evaluation with ureteroscopy. We discussed the procedure in detail including its risks and complications, the alternatives, the probability of success as well as the outpatient nature of the procedure, the need for a stent post-procedurally and the anticipated postoperative course. I told her that I would at the very least obtain a biopsy and depending on the size and extent of the area involved it may be treatable with ablation using either laser or Bugbee.    We also discussed the other incidentally noted benign findings on her CT scan including the finding of bilateral renal cysts as well as bilateral ureteral duplication.     Plan  She'll be scheduled for right ureteroscopy with biopsy and possible treatment of her right upper pole infundibular filling defects.

## 2015-09-22 NOTE — Anesthesia Procedure Notes (Signed)
Procedure Name: LMA Insertion Date/Time: 09/22/2015 8:21 AM Performed by: Denna Haggard D Pre-anesthesia Checklist: Patient identified, Emergency Drugs available, Suction available and Patient being monitored Patient Re-evaluated:Patient Re-evaluated prior to inductionOxygen Delivery Method: Circle System Utilized Preoxygenation: Pre-oxygenation with 100% oxygen Intubation Type: IV induction Ventilation: Mask ventilation without difficulty LMA: LMA inserted LMA Size: 4.0 Number of attempts: 1 Airway Equipment and Method: Bite block Placement Confirmation: positive ETCO2 Tube secured with: Tape Dental Injury: Teeth and Oropharynx as per pre-operative assessment

## 2015-09-22 NOTE — Anesthesia Postprocedure Evaluation (Signed)
Anesthesia Post Note  Patient: Jennifer Wall  Procedure(s) Performed: Procedure(s) (LRB): CYSTOSCOPY WITH RIGHT RETROGRADE PYELOGRAM, URETEROSCOPY AND STENT PLACEMENT, BIOPSY (Right)  Patient location during evaluation: PACU Anesthesia Type: General Level of consciousness: awake and alert Pain management: pain level controlled Vital Signs Assessment: post-procedure vital signs reviewed and stable Respiratory status: spontaneous breathing, nonlabored ventilation and respiratory function stable Cardiovascular status: blood pressure returned to baseline and stable Postop Assessment: no signs of nausea or vomiting Anesthetic complications: no    Last Vitals:  Filed Vitals:   09/22/15 0940 09/22/15 0945  BP:  146/68  Pulse: 66 61  Temp:    Resp: 15 16    Last Pain: There were no vitals filed for this visit.               Zenaida Deed

## 2015-09-22 NOTE — Addendum Note (Signed)
Addendum  created 09/22/15 1112 by Suan Halter, CRNA   Modules edited: Anesthesia Events, Narrator   Narrator:  Narrator: Event Log Edited

## 2015-09-22 NOTE — Discharge Instructions (Signed)
Post renal biopsy & stent placement surgery instructions   Definitions:  Ureter: The duct that transports urine from the kidney to the bladder. Stent: A plastic hollow tube that is placed into the ureter, from the kidney to the bladder to prevent the ureter from swelling shut.  General instructions:  Despite the fact that no skin incisions were used, the area around the ureter and bladder is raw and irritated. The stent is a foreign body which will further irritate the bladder wall. This irritation is manifested by increased frequency of urination, both day and night, and by an increase in the urge to urinate. In some, the urge to urinate is present almost always. Sometimes the urge is strong enough that you may not be able to stop your self from urinating. The only real cure is to remove the stent and then give time for the bladder wall to heal which can't be done until the danger of the ureter swelling shut has passed. (This varies from 2-21 days).  You may see some blood in your urine while the stent is in place and a few days afterward. Do not be alarmed, even if the urine is clear for a while. Get off your feet and drink lots of fluids until clearing occurs. If you start to pass clots or don't improve, call us.  If you have a string coming from your urethra:  The stent string is attached to your ureteral stent.  Do not pull on thisIf you have a string coming from your urethra:  The stent string is attached to your ureteral stent.  Do not pull on this.  Diet:  You may return to your normal diet immediately. Because of the raw surface of your bladder, alcohol, spicy foods, foods high in acid and drinks with caffeine may cause irritation or frequency and should be used in moderation. To keep your urine flowing freely and avoid constipation, drink plenty of fluids during the day (8-10 glasses). Tip: Avoid cranberry juice because it is very acidic.  Activity:  Your physical activity doesn't  need to be restricted. However, if you are very active, you may see some blood in the urine. We suggest that you reduce your activity under the circumstances until the bleeding has stopped.  Bowels:  It is important to keep your bowels regular during the postoperative period. Straining with bowel movements can cause bleeding. A bowel movement every other day is reasonable. Use a mild laxative if needed, such as milk of magnesia 2-3 tablespoons, or 2 Dulcolax tablets. Call if you continue to have problems. If you had been taking narcotics for pain, before, during or after your surgery, you may be constipated. Take a laxative if necessary.     Medication:  You should resume your pre-surgery medications unless told not to. DO NOT RESUME YOUR ASPIRIN, or any other medicines like ibuprofen, motrin, excedrin, advil, aleve, vitamin E, fish oil as these can all cause bleeding x 7 days. In addition you may be given an antibiotic to prevent or treat infection. Antibiotics are not always necessary. All medication should be taken as prescribed until the bottles are finished unless you are having an unusual reaction to one of the drugs.  Problems you should report to [place]:  a. Fever greater than 101F. b. Heavy bleeding, or clots (see notes above about blood in urine). c. Inability to urinate. d. Drug reactions (hives, rash, nausea, vomiting, diarrhea). e. Severe burning or pain with urination that is not improving.  Followup:  You will need a followup appointment to monitor your progress in most cases. Please call the office for this appointment when you get home if your appointment has not already been scheduled. Usually the first appointment will be about 5-14 days after your surgery and if you have a stent in place it will likely be removed at that time.      Post Anesthesia Home Care Instructions  Activity: Get plenty of rest for the remainder of the day. A responsible adult should stay with  you for 24 hours following the procedure.  For the next 24 hours, DO NOT: -Drive a car -Paediatric nurse -Drink alcoholic beverages -Take any medication unless instructed by your physician -Make any legal decisions or sign important papers.  Meals: Start with liquid foods such as gelatin or soup. Progress to regular foods as tolerated. Avoid greasy, spicy, heavy foods. If nausea and/or vomiting occur, drink only clear liquids until the nausea and/or vomiting subsides. Call your physician if vomiting continues.  Special Instructions/Symptoms: Your throat may feel dry or sore from the anesthesia or the breathing tube placed in your throat during surgery. If this causes discomfort, gargle with warm salt water. The discomfort should disappear within 24 hours.  If you had a scopolamine patch placed behind your ear for the management of post- operative nausea and/or vomiting:  1. The medication in the patch is effective for 72 hours, after which it should be removed.  Wrap patch in a tissue and discard in the trash. Wash hands thoroughly with soap and water. 2. You may remove the patch earlier than 72 hours if you experience unpleasant side effects which may include dry mouth, dizziness or visual disturbances. 3. Avoid touching the patch. Wash your hands with soap and water after contact with the patch.

## 2015-09-25 ENCOUNTER — Encounter (HOSPITAL_BASED_OUTPATIENT_CLINIC_OR_DEPARTMENT_OTHER): Payer: Self-pay | Admitting: Urology

## 2015-10-02 ENCOUNTER — Other Ambulatory Visit: Payer: Self-pay | Admitting: Urology

## 2015-11-03 ENCOUNTER — Encounter (HOSPITAL_BASED_OUTPATIENT_CLINIC_OR_DEPARTMENT_OTHER): Payer: Self-pay | Admitting: *Deleted

## 2015-11-03 NOTE — Progress Notes (Signed)
To [facility] at [place] on arrival,Ekg with chart,will take protonix,metoprolol in am-Npo after Mn.

## 2015-11-13 ENCOUNTER — Ambulatory Visit (HOSPITAL_BASED_OUTPATIENT_CLINIC_OR_DEPARTMENT_OTHER): Payer: Medicare Other | Admitting: Anesthesiology

## 2015-11-13 ENCOUNTER — Encounter (HOSPITAL_BASED_OUTPATIENT_CLINIC_OR_DEPARTMENT_OTHER)
Admission: RE | Disposition: A | Discharge status: Home / Self Care | Payer: Self-pay | Source: Ambulatory Visit | Attending: Urology

## 2015-11-13 ENCOUNTER — Ambulatory Visit (HOSPITAL_BASED_OUTPATIENT_CLINIC_OR_DEPARTMENT_OTHER)
Admission: RE | Admit: 2015-11-13 | Discharge: 2015-11-13 | Disposition: A | Discharge status: Home / Self Care | Payer: Medicare Other | Source: Ambulatory Visit | Attending: Urology | Admitting: Urology

## 2015-11-13 ENCOUNTER — Encounter (HOSPITAL_BASED_OUTPATIENT_CLINIC_OR_DEPARTMENT_OTHER): Payer: Self-pay | Admitting: *Deleted

## 2015-11-13 DIAGNOSIS — Q648 Other specified congenital malformations of urinary system: Secondary | ICD-10-CM | POA: Diagnosis not present

## 2015-11-13 DIAGNOSIS — E7800 Pure hypercholesterolemia, unspecified: Secondary | ICD-10-CM | POA: Insufficient documentation

## 2015-11-13 DIAGNOSIS — N281 Cyst of kidney, acquired: Secondary | ICD-10-CM | POA: Diagnosis not present

## 2015-11-13 DIAGNOSIS — R310 Gross hematuria: Secondary | ICD-10-CM | POA: Insufficient documentation

## 2015-11-13 DIAGNOSIS — Z85528 Personal history of other malignant neoplasm of kidney: Secondary | ICD-10-CM | POA: Diagnosis not present

## 2015-11-13 DIAGNOSIS — Z7982 Long term (current) use of aspirin: Secondary | ICD-10-CM | POA: Insufficient documentation

## 2015-11-13 DIAGNOSIS — K219 Gastro-esophageal reflux disease without esophagitis: Secondary | ICD-10-CM | POA: Insufficient documentation

## 2015-11-13 DIAGNOSIS — I10 Essential (primary) hypertension: Secondary | ICD-10-CM | POA: Diagnosis not present

## 2015-11-13 DIAGNOSIS — Z85820 Personal history of malignant melanoma of skin: Secondary | ICD-10-CM | POA: Insufficient documentation

## 2015-11-13 DIAGNOSIS — E559 Vitamin D deficiency, unspecified: Secondary | ICD-10-CM | POA: Insufficient documentation

## 2015-11-13 DIAGNOSIS — F329 Major depressive disorder, single episode, unspecified: Secondary | ICD-10-CM | POA: Insufficient documentation

## 2015-11-13 DIAGNOSIS — Z79899 Other long term (current) drug therapy: Secondary | ICD-10-CM | POA: Diagnosis not present

## 2015-11-13 DIAGNOSIS — E785 Hyperlipidemia, unspecified: Secondary | ICD-10-CM | POA: Insufficient documentation

## 2015-11-13 DIAGNOSIS — K449 Diaphragmatic hernia without obstruction or gangrene: Secondary | ICD-10-CM | POA: Insufficient documentation

## 2015-11-13 DIAGNOSIS — Z85828 Personal history of other malignant neoplasm of skin: Secondary | ICD-10-CM | POA: Insufficient documentation

## 2015-11-13 DIAGNOSIS — C651 Malignant neoplasm of right renal pelvis: Secondary | ICD-10-CM

## 2015-11-13 HISTORY — PX: CYSTOSCOPY WITH RETROGRADE PYELOGRAM, URETEROSCOPY AND STENT PLACEMENT: SHX5789

## 2015-11-13 LAB — POCT I-STAT 4, (NA,K, GLUC, HGB,HCT)
Glucose, Bld: 135 mg/dL — ABNORMAL HIGH (ref 65–99)
HCT: 42 % (ref 36.0–46.0)
Hemoglobin: 14.3 g/dL (ref 12.0–15.0)
Potassium: 4.1 mmol/L (ref 3.5–5.1)
Sodium: 140 mmol/L (ref 135–145)

## 2015-11-13 SURGERY — CYSTOURETEROSCOPY, WITH RETROGRADE PYELOGRAM AND STENT INSERTION
Anesthesia: General | Laterality: Right

## 2015-11-13 MED ORDER — EPHEDRINE SULFATE 50 MG/ML IJ SOLN
INTRAMUSCULAR | Status: AC
  Filled 2015-11-13: qty 1

## 2015-11-13 MED ORDER — EPHEDRINE SULFATE 50 MG/ML IJ SOLN
INTRAMUSCULAR | Status: DC | PRN
  Administered 2015-11-13: 10 mg via INTRAVENOUS

## 2015-11-13 MED ORDER — OXYBUTYNIN CHLORIDE 5 MG PO TABS
ORAL_TABLET | ORAL | Status: AC
  Filled 2015-11-13: qty 1

## 2015-11-13 MED ORDER — LIDOCAINE HCL (CARDIAC) 20 MG/ML IV SOLN
INTRAVENOUS | Status: DC | PRN
  Administered 2015-11-13: 50 mg via INTRAVENOUS

## 2015-11-13 MED ORDER — PHENAZOPYRIDINE HCL 200 MG PO TABS: 200.0000 mg | ORAL_TABLET | Freq: Three times a day (TID) | ORAL | Status: DC | PRN

## 2015-11-13 MED ORDER — TAMSULOSIN HCL 0.4 MG PO CAPS
0.4000 mg | ORAL_CAPSULE | Freq: Once | ORAL | Status: DC
  Filled 2015-11-13: qty 1

## 2015-11-13 MED ORDER — DEXAMETHASONE SODIUM PHOSPHATE 4 MG/ML IJ SOLN
INTRAMUSCULAR | Status: DC | PRN
Start: 2015-11-13 — End: 2015-11-13
  Administered 2015-11-13: 10 mg via INTRAVENOUS

## 2015-11-13 MED ORDER — MIDAZOLAM HCL 5 MG/5ML IJ SOLN
INTRAMUSCULAR | Status: DC | PRN
  Administered 2015-11-13: 1 mg via INTRAVENOUS

## 2015-11-13 MED ORDER — ACETAMINOPHEN 325 MG PO TABS
325.0000 mg | ORAL_TABLET | ORAL | Status: DC | PRN
  Filled 2015-11-13: qty 2

## 2015-11-13 MED ORDER — FENTANYL CITRATE (PF) 100 MCG/2ML IJ SOLN
25.0000 ug | INTRAMUSCULAR | Status: DC | PRN
  Filled 2015-11-13: qty 1

## 2015-11-13 MED ORDER — MIDAZOLAM HCL 2 MG/2ML IJ SOLN
INTRAMUSCULAR | Status: AC
  Filled 2015-11-13: qty 2

## 2015-11-13 MED ORDER — CIPROFLOXACIN IN D5W 200 MG/100ML IV SOLN
INTRAVENOUS | Status: AC
  Filled 2015-11-13: qty 100

## 2015-11-13 MED ORDER — ONDANSETRON HCL 4 MG/2ML IJ SOLN
INTRAMUSCULAR | Status: DC | PRN
  Administered 2015-11-13: 4 mg via INTRAVENOUS

## 2015-11-13 MED ORDER — PHENAZOPYRIDINE HCL 100 MG PO TABS
ORAL_TABLET | ORAL | Status: AC
  Filled 2015-11-13: qty 2

## 2015-11-13 MED ORDER — ACETAMINOPHEN 160 MG/5ML PO SOLN
325.0000 mg | ORAL | Status: DC | PRN
  Filled 2015-11-13: qty 20.3

## 2015-11-13 MED ORDER — CIPROFLOXACIN IN D5W 200 MG/100ML IV SOLN
200.0000 mg | INTRAVENOUS | Status: AC
  Administered 2015-11-13: 200 mg via INTRAVENOUS
  Filled 2015-11-13: qty 100

## 2015-11-13 MED ORDER — PROPOFOL 10 MG/ML IV BOLUS
INTRAVENOUS | Status: AC
  Filled 2015-11-13: qty 20

## 2015-11-13 MED ORDER — OXYCODONE HCL 5 MG/5ML PO SOLN
5.0000 mg | Freq: Once | ORAL | Status: DC | PRN
  Filled 2015-11-13: qty 5

## 2015-11-13 MED ORDER — DEXAMETHASONE SODIUM PHOSPHATE 10 MG/ML IJ SOLN
INTRAMUSCULAR | Status: AC
  Filled 2015-11-13: qty 1

## 2015-11-13 MED ORDER — ARTIFICIAL TEARS OP OINT
TOPICAL_OINTMENT | OPHTHALMIC | Status: AC
  Filled 2015-11-13: qty 3.5

## 2015-11-13 MED ORDER — FENTANYL CITRATE (PF) 100 MCG/2ML IJ SOLN
INTRAMUSCULAR | Status: AC
  Filled 2015-11-13: qty 2

## 2015-11-13 MED ORDER — FENTANYL CITRATE (PF) 100 MCG/2ML IJ SOLN
INTRAMUSCULAR | Status: DC | PRN
  Administered 2015-11-13 (×2): 25 ug via INTRAVENOUS

## 2015-11-13 MED ORDER — LACTATED RINGERS IV SOLN
INTRAVENOUS | Status: DC
  Administered 2015-11-13 (×2): via INTRAVENOUS
  Filled 2015-11-13: qty 1000

## 2015-11-13 MED ORDER — HYDROCODONE-ACETAMINOPHEN 10-325 MG PO TABS: 1.0000 | ORAL_TABLET | ORAL | Status: DC | PRN

## 2015-11-13 MED ORDER — PROPOFOL 10 MG/ML IV BOLUS
INTRAVENOUS | Status: DC | PRN
  Administered 2015-11-13: 150 mg via INTRAVENOUS

## 2015-11-13 MED ORDER — OXYBUTYNIN CHLORIDE 5 MG PO TABS
5.0000 mg | ORAL_TABLET | Freq: Once | ORAL | Status: AC
  Administered 2015-11-13: 5 mg via ORAL
  Filled 2015-11-13: qty 1

## 2015-11-13 MED ORDER — OXYCODONE HCL 5 MG PO TABS
5.0000 mg | ORAL_TABLET | Freq: Once | ORAL | Status: DC | PRN
  Filled 2015-11-13: qty 1

## 2015-11-13 MED ORDER — PHENAZOPYRIDINE HCL 200 MG PO TABS
200.0000 mg | ORAL_TABLET | Freq: Once | ORAL | Status: AC
  Administered 2015-11-13: 200 mg via ORAL
  Filled 2015-11-13: qty 1

## 2015-11-13 MED ORDER — ONDANSETRON HCL 4 MG/2ML IJ SOLN
INTRAMUSCULAR | Status: AC
Start: 2015-11-13 — End: 2015-11-13
  Filled 2015-11-13: qty 2

## 2015-11-13 SURGICAL SUPPLY — 45 items
ADAPTER CATH URET PLST 4-6FR (CATHETERS) IMPLANT
ADPR CATH URET STRL DISP 4-6FR (CATHETERS)
BAG DRAIN URO-CYSTO SKYTR STRL (DRAIN) ×2 IMPLANT
BAG DRN UROCATH (DRAIN) ×1
BASKET LASER NITINOL 1.9FR (BASKET) IMPLANT
BASKET STNLS GEMINI 4WIRE 3FR (BASKET) IMPLANT
BASKET ZERO TIP NITINOL 2.4FR (BASKET) ×1 IMPLANT
BSKT STON RTRVL 120 1.9FR (BASKET)
BSKT STON RTRVL GEM 120X11 3FR (BASKET)
BSKT STON RTRVL ZERO TP 2.4FR (BASKET) ×1
CANISTER SUCT LVC 12 LTR MEDI- (MISCELLANEOUS) IMPLANT
CATH CLEAR GEL 3F BACKSTOP (CATHETERS) IMPLANT
CATH INTERMIT  6FR 70CM (CATHETERS) IMPLANT
CATH URET 5FR 28IN CONE TIP (BALLOONS)
CATH URET 5FR 70CM CONE TIP (BALLOONS) IMPLANT
CLOTH BEACON ORANGE TIMEOUT ST (SAFETY) ×2 IMPLANT
ELECT REM PT RETURN 9FT ADLT (ELECTROSURGICAL)
ELECTRODE REM PT RTRN 9FT ADLT (ELECTROSURGICAL) IMPLANT
FIBER LASER FLEXIVA 365 (UROLOGICAL SUPPLIES) IMPLANT
FIBER LASER FLEXIVA 550 (UROLOGICAL SUPPLIES) IMPLANT
FIBER LASER TRAC TIP (UROLOGICAL SUPPLIES) IMPLANT
GLOVE BIO SURGEON STRL SZ 6.5 (GLOVE) ×1 IMPLANT
GLOVE BIO SURGEON STRL SZ8 (GLOVE) ×2 IMPLANT
GLOVE BIOGEL PI IND STRL 6.5 (GLOVE) IMPLANT
GLOVE BIOGEL PI INDICATOR 6.5 (GLOVE) ×1
GOWN STRL REUS W/ TWL LRG LVL3 (GOWN DISPOSABLE) ×1 IMPLANT
GOWN STRL REUS W/ TWL XL LVL3 (GOWN DISPOSABLE) ×1 IMPLANT
GOWN STRL REUS W/TWL LRG LVL3 (GOWN DISPOSABLE) ×2
GOWN STRL REUS W/TWL XL LVL3 (GOWN DISPOSABLE) ×2
GUIDEWIRE 0.038 PTFE COATED (WIRE) IMPLANT
GUIDEWIRE ANG ZIPWIRE 038X150 (WIRE) IMPLANT
GUIDEWIRE STR DUAL SENSOR (WIRE) ×2 IMPLANT
IV NS IRRIG 3000ML ARTHROMATIC (IV SOLUTION) ×4 IMPLANT
KIT BALLIN UROMAX 15FX10 (LABEL) IMPLANT
KIT BALLN UROMAX 15FX4 (MISCELLANEOUS) IMPLANT
KIT BALLN UROMAX 26 75X4 (MISCELLANEOUS)
KIT ROOM TURNOVER WOR (KITS) ×2 IMPLANT
MANIFOLD NEPTUNE II (INSTRUMENTS) ×1 IMPLANT
PACK CYSTO (CUSTOM PROCEDURE TRAY) ×2 IMPLANT
SET HIGH PRES BAL DIL (LABEL)
SHEATH ACCESS URETERAL 38CM (SHEATH) ×1 IMPLANT
STENT POLARIS 5FRX26 (STENTS) ×1 IMPLANT
SYR 5ML LL (SYRINGE) ×1 IMPLANT
TUBE CONNECTING 12X1/4 (SUCTIONS) ×1 IMPLANT
WATER STERILE IRR 3000ML UROMA (IV SOLUTION) IMPLANT

## 2015-11-13 NOTE — Anesthesia Procedure Notes (Signed)
Procedure Name: LMA Insertion Date/Time: 11/13/2015 7:33 AM Performed by: Mechele Claude Pre-anesthesia Checklist: Patient identified, Emergency Drugs available, Suction available and Patient being monitored Patient Re-evaluated:Patient Re-evaluated prior to inductionOxygen Delivery Method: Circle System Utilized Preoxygenation: Pre-oxygenation with 100% oxygen Intubation Type: IV induction Ventilation: Mask ventilation without difficulty LMA: LMA inserted LMA Size: 4.0 Number of attempts: 1 Airway Equipment and Method: bite block Placement Confirmation: positive ETCO2 Tube secured with: Tape Dental Injury: Teeth and Oropharynx as per pre-operative assessment

## 2015-11-13 NOTE — H&P (Signed)
Jennifer Wall is a 70 year old female who returns status post ureteroscopic evaluation of a right upper pole renal filling defect and gross hematuria.   History of Present Illness     Gross hematuria: She experienced intermittent gross hematuria with no discomfort and no risk factors identified.  Workup in 6/16: CT scan, cystoscopy and cytology - negative.  CT scan [id number] at [facility] - duplicated system on the right hand side with a 2.1 x 2.4 cm stone in her bladder described as being in an inferior cystocele.  I reviewed the CT scan images from [facility] which revealed a density in what appeared to be a cystocele. It appeared to be quite large and was seen on axial images only as this area was cut off on sagittal and coronal imaging.  Cystoscopy 8/16 - large cystocele, no bladder stones and no bladder lesions.    Bilateral renal cyst: These were incidentally noted on CT scan in 6/16.    Cystocele: She has moderate pelvic prolapse that actually could be seen on her CT scan. She is asymptomatic from this.     Interval history: She underwent ureteroscopy and RPG on 09/22/15. She said she has seen some intermittent hematuria and also noted some malodorous urine couple of days ago but that has cleared. She has some irritation to the bladder but that is likely secondary to the presence of her stent.   Past Medical History Problems  1. History of basal cell carcinoma (Z85.828) 2. History of colonic polyps (Z86.010) 3. History of depression (Z86.59) 4. History of esophageal reflux (Z87.19) 5. History of hypercholesterolemia (Z86.39) 6. History of hyperlipidemia (Z86.39) 7. History of hypertension (Z86.79) 8. History of malignant melanoma (Z85.820) 9. History of osteopenia (Z87.39) 10. History of Primary malignant neuroendocrine neoplasm of duodenum (C7A.8) 11. History of Vitamin D deficiency (E55.9)  Surgical History Problems  1. History of Back Surgery 2. History of Radical  Pancreaticoduodenectomy  Current Meds 1. Aspirin 81 MG TABS;  Therapy: (Recorded:22Jun2016) to Recorded 2. Biotin CAPS;  Therapy: (Recorded:22Jun2016) to Recorded 3. Calcium + D TABS;  Therapy: (Recorded:22Jun2016) to Recorded 4. CeleXA 10 MG Oral Tablet;  Therapy: (Recorded:05Jan2017) to Recorded 5. Lisinopril 20 MG Oral Tablet;  Therapy: (Recorded:05Jan2017) to Recorded 6. Lovaza 1 GM Oral Capsule;  Therapy: (Recorded:22Jun2016) to Recorded 7. Magnesium CAPS;  Therapy: (Recorded:22Jun2016) to Recorded 8. Metoprolol Tartrate 50 MG Oral Tablet;  Therapy: (Recorded:05Jan2017) to Recorded 9. Multi-Vitamin TABS;  Therapy: (Recorded:22Jun2016) to Recorded 10. Pantoprazole Sodium 40 MG Oral Tablet Delayed Release;   Therapy: (Recorded:05Jan2017) to Recorded 11. Simvastatin 40 MG Oral Tablet;   Therapy: (Recorded:22Jun2016) to Recorded 12. Triamterene-HCTZ 37.5-25 MG Oral Tablet;   Therapy: (Recorded:22Jun2016) to Recorded 13. Vitamin C 500 MG Oral Tablet;   Therapy: (Recorded:22Jun2016) to Recorded 14. Vitamin D 50000 UNIT CAPS;   Therapy: (Recorded:22Jun2016) to Recorded  Allergies Medication  1. Erythromycin TABS 2. Tricor TABS  Family History Problems  1. Family history of Deceased : Mother, Father 2. Family history of hypertension (Z82.49) : Father 3. Family history of liver cancer (Z80.0) : Mother 4. Family history of renal failure (Z84.1) : Father  Social History Problems  1. Alcohol use (Z78.9)   ocassionally 2. Caffeine use (F15.90) 3. Married 4. Never a smoker 5. Two children   Vitals  Height: 5 ft 2 in Weight: 145 lb  BMI Calculated: 26.52 BSA Calculated: 1.67 Blood Pressure: 123 / 59 Heart Rate: 61  Review of Systems Genitourinary, constitutional, skin, eye, otolaryngeal, hematologic/lymphatic, cardiovascular, pulmonary, endocrine,  musculoskeletal, gastrointestinal, neurological and psychiatric system(s) were reviewed and pertinent findings if  present are noted and are otherwise negative.  Genitourinary: hematuria.  Musculoskeletal: back pain.   Physical Exam Constitutional: Well nourished and well developed . No acute distress.   ENT:. The ears and nose are normal in appearance.   Neck: The appearance of the neck is normal and no neck mass is present.   Pulmonary: No respiratory distress and normal respiratory rhythm and effort.   Cardiovascular: Heart rate and rhythm are normal . No peripheral edema.   Abdomen: The abdomen is soft and nontender. No masses are palpated. No CVA tenderness. No hernias are palpable. No hepatosplenomegaly noted.   Lymphatics: The femoral and inguinal nodes are not enlarged or tender.   Skin: Normal skin turgor, no visible rash and no visible skin lesions.   Neuro/Psych:. Mood and affect are appropriate.    Assessment   I had a long discussion with the patient in her husband today about my intraoperative and pathologic findings. 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 This was not present on her CT scan done about 7 months previously and therefore very possibly has been found at an early stage. There was no way to obtain deep tissue and we discussed the fact that I cannot determine if her cancer was superficial or whether there was invasion. We therefore discussed the treatment options and I told her that the goal standard for high-grade urothelial malignancy of the kidney would be a nephroureterectomy. She has normal renal function but does have hypertension. She would tolerate a nephroureterectomy in my opinion. On the other hand the area seemed to be fairly contained and I felt pretty good about being able to fulgurate all of the lesion that was present. The other option would be to remove her stent which has been bothering her somewhat.  I would then proceed with cystoscopy and right retrograde pyelogram with  ureteroscopy and biopsy and possible further fulguration. We discussed the fact that if I find disease present in other locations or having recurred in the same location then further consideration of a nephroureterectomy would need to be given. She does understand that there is some risk with this approach which could potentially allow her to keep her kidney but does come with some risk and would likely require surveillance of the upper tract on a regular basis for a while.   At this point she has elected to repeat ureteroscopy and then we will make a further decision after I have reevaluated her kidney.  Her preoperative urine culture was found to be negative.   Plan   She is scheduled for repeat ureteroscopy and biopsy.

## 2015-11-13 NOTE — Op Note (Signed)
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 Cystoscopy with right retrograde pyelogram including interpretation. 2. Right ureteroscopy with biopsy and barbotage of right upper pole calyx. 3. Fulguration of right upper pole calyx.  SURGEON:  Claybon Jabs  INDICATION: Jennifer Wall is a 70 year old female who was experiencing intermittent gross hematuria which was localized to the right ureteral orifice. 6 weeks ago she underwent right ureteroscopy and I found a papillary lesion in an upper pole calyx and biopsied and fulgurated completely all visible tumor. We therefore discussed nephroureterectomy versus a more conservative management approach and she has elected to proceed conservatively initially.   ANESTHESIA:  General  EBL:  Minimal  DRAINS5 French, 26 cm Polaris stent in the right ureter (with string)  LOCAL MEDICATIONS USED:  None  SPECIMEN:   1. Barbotaged from the right upper pole calyx   2. Biopsy of right upper pole calyx  Description of procedure: After informed consent the patient was taken to the operating room and placed on the table in a supine position. General anesthesia was then administered. Once fully anesthetized the patient was moved to the dorsal lithotomy position and the genitalia were sterilely prepped and draped in standard fashion. An official timeout was then performed.   the 67 French cystoscope was introduced into the bladder and the bladder was fully and systematically inspected with no abnormality noted of the mucosa. Ureteral orifices were of normal configuration and position. I proceeded with performing a right retrograde pyelogram in the standard fashion.  A 6 French open-ended ureteral catheter was passed through the cystoscope and into the right ureteral orifice. I then injected full-strength Omnipaque contrast  through the open-ended stent and up the right ureter. It revealed a normal ureter throughout its length. Halfway up the ureter the ureter bifurcated and the upper and lower pole collecting systems were opacified and noted to be free of any obvious filling defects.  I then passed a 0.038 inch floppy-tipped guidewire through the open-ended catheter and was having some difficulty negotiating into the upper pole system so I left the guidewire in the mid ureter and passed the ureteral access sheath over the guidewire and up to this level. I then used the flexible, digital ureteroscope and passed this under direct vision up the ureter that was confirmed fluoroscopically to be the ureter portending the upper pole. I passed the guidewire through the ureteroscope and then removed the ureteroscope and replaced the inner portion of the access sheath and advanced this up to the level of the ureteropelvic junction of the upper pole moiety. I removed the inner portion of the access sheath and the guidewire and proceeded with diagnostic ureteroscopy.  I again passed the digital flexible ureteroscope through the access sheath and up into the upper pole moiety. The upper pole calyx again was noted to be free of any lesions. The lower pole calyx did not have any obvious papillary lesions identified. There was some evidence of scarring from previous fulguration. The upper pole system had virtually no renal pelvis and therefore I had a difficult time performing barbotaged but I was able to fill the upper pole system and empty it several times and sent this for cytology. The location of the previous tumor was on the lateral wall of the calyx and did not lend itself to easy biopsy. I tried using the ureteroscopic biopsy forceps and also  tried engaging tissue with the 0 tip nitinol basket was unsuccessful due to the location and the fact that there was no obvious papillary lesion to be engaged.  I therefore used the Bugbee electrode  and again fulgurated the area where the previous tumor had been located. Once this was complete I reinserted the guidewire through the ureteroscope and left this in place. I removed the ureteroscope and the access sheath and backloaded the cystoscope. I passed the double-J stent over the guidewire and then noted good curl in the kidney. The bladder was then drained and the patient was awakened and taken to the recovery room in stable and satisfactory condition. She tolerated procedure well with no intraoperative complications.    PLAN OF CARE: Discharge to home after PACU  PATIENT DISPOSITION:  PACU - hemodynamically stable.

## 2015-11-13 NOTE — Anesthesia Preprocedure Evaluation (Signed)
Anesthesia Evaluation  Patient identified by MRN, date of birth, ID band Patient awake    Reviewed: Allergy & Precautions, NPO status , Patient's Chart, lab work & pertinent test results  History of Anesthesia Complications Negative for: history of anesthetic complications  Airway Mallampati: II  TM Distance: >3 FB Neck ROM: Full    Dental no notable dental hx. (+) Teeth Intact   Pulmonary neg pulmonary ROS,    Pulmonary exam normal breath sounds clear to auscultation       Cardiovascular Exercise Tolerance: Good hypertension, Pt. on medications and Pt. on home beta blockers Normal cardiovascular exam+ Valvular Problems/Murmurs  Rhythm:Regular Rate:Normal     Neuro/Psych  Neuromuscular disease negative psych ROS   GI/Hepatic Neg liver ROS, hiatal hernia, GERD  Controlled and Medicated,  Endo/Other  negative endocrine ROS  Renal/GU   negative genitourinary   Musculoskeletal negative musculoskeletal ROS (+)   Abdominal   Peds negative pediatric ROS (+)  Hematology negative hematology ROS (+)   Anesthesia Other Findings Denies cardiac or pulmonary symtpoms, EKG in sinus today, NPO appropriate  Reproductive/Obstetrics                             Anesthesia Physical Anesthesia Plan  ASA: III  Anesthesia Plan: General   Post-op Pain Management:    Induction: Intravenous  Airway Management Planned: LMA  Additional Equipment: None  Intra-op Plan:   Post-operative Plan: Extubation in OR  Informed Consent: I have reviewed the patients History and Physical, chart, labs and discussed the procedure including the risks, benefits and alternatives for the proposed anesthesia with the patient or authorized representative who has indicated his/her understanding and acceptance.   Dental advisory given  Plan Discussed with: CRNA and Surgeon  Anesthesia Plan Comments:          Anesthesia Quick Evaluation

## 2015-11-13 NOTE — Transfer of Care (Signed)
Last Vitals:  Filed Vitals:   11/13/15 0623  BP: 141/63  Pulse: 52  Temp: 36.9 C  Resp: 14    Immediate Anesthesia Transfer of Care Note  Patient: Jennifer Wall  Procedure(s) Performed: Procedure(s) (LRB): CYSTOSCOPY WITH RIGHT RETROGRADE PYELOGRAM, URETEROSCOPY BIOPSY, FULGURATION  AND STENT  (Right)  Patient Location: PACU  Anesthesia Type: General  Level of Consciousness: awake, alert  and oriented  Airway & Oxygen Therapy: Patient Spontanous Breathing and Patient connected to face mask oxygen  Post-op Assessment: Report given to PACU RN and Post -op Vital signs reviewed and stable  Post vital signs: Reviewed and stable  Complications: No apparent anesthesia complications

## 2015-11-13 NOTE — Anesthesia Postprocedure Evaluation (Signed)
Anesthesia Post Note  Patient: Jennifer Wall  Procedure(s) Performed: Procedure(s) (LRB): CYSTOSCOPY WITH RIGHT RETROGRADE PYELOGRAM, URETEROSCOPY BIOPSY, FULGURATION  AND STENT  (Right)  Patient location during evaluation: PACU Anesthesia Type: General Level of consciousness: awake Pain management: pain level controlled Vital Signs Assessment: post-procedure vital signs reviewed and stable Respiratory status: spontaneous breathing and respiratory function stable Cardiovascular status: stable Postop Assessment: no signs of nausea or vomiting Anesthetic complications: no    Last Vitals:  Filed Vitals:   11/13/15 0930 11/13/15 1047  BP: 134/77 126/58  Pulse: 58 56  Temp:  36.4 C  Resp: 12 20    Last Pain:  Filed Vitals:   11/13/15 1048  PainSc: Asleep                 Leyda Vanderwerf

## 2015-11-13 NOTE — Discharge Instructions (Addendum)
Post stent placement instructions   Definitions:  Ureter: The duct that transports urine from the kidney to the bladder. Stent: A plastic hollow tube that is placed into the ureter, from the kidney to the bladder to prevent the ureter from swelling shut.  General instructions:  Despite the fact that no skin incisions were used, the area around the ureter and bladder is raw and irritated. The stent is a foreign body which can further irritate the bladder wall. This irritation is manifested by increased frequency of urination, both day and night, and by an increase in the urge to urinate. In some, the urge to urinate is present almost always. Sometimes the urge is strong enough that you may not be able to stop your self from urinating. This can often be controlled with medication but does not occur in everyone. A stent can safely be left in place for 3 months or greater.  You may see some blood in your urine while the stent is in place and a few days afterward. Do not be alarmed, even if the urine is clear for a while. Get off your feet and drink lots of fluids until clearing occurs. If you start to pass clots or don't improve, call us.  Diet:  You may return to your normal diet immediately. Because of the raw surface of your bladder, alcohol, spicy foods, foods high in acid and drinks with caffeine may cause irritation or frequency and should be used in moderation. To keep your urine flowing freely and avoid constipation, drink plenty of fluids during the day (8-10 glasses). Tip: Avoid cranberry juice because it is very acidic.  Activity:  Your physical activity doesn't need to be restricted. However, if you are very active, you may see some blood in the urine. We suggest that you reduce your activity under the circumstances until the bleeding has stopped.  Bowels:  It is important to keep your bowels regular during the postoperative period. Straining with bowel movements can cause bleeding. A  bowel movement every other day is reasonable. Use a mild laxative if needed, such as milk of magnesia 2-3 tablespoons, or 2 Dulcolax tablets. Call if you continue to have problems. If you had been taking narcotics for pain, before, during or after your surgery, you may be constipated. Take a laxative if necessary.  Medication:  You should resume your pre-surgery medications unless told not to. In addition you may be given an antibiotic to prevent or treat infection. Antibiotics are not always necessary. All medication should be taken as prescribed until the bottles are finished unless you are having an unusual reaction to one of the drugs.  Problems you should report to [place]:  a. Fever greater than 101F. b. Heavy bleeding, or clots (see notes above about blood in urine). c. Inability to urinate. d. Drug reactions (hives, rash, nausea, vomiting, diarrhea). e. Severe burning or pain with urination that is not improving.   CYSTOSCOPY HOME CARE INSTRUCTIONS  Activity: Rest for the remainder of the day.  Do not drive or operate equipment today.  You may resume normal activities in one to two days as instructed by your physician.   Meals: Drink plenty of liquids and eat light foods such as gelatin or soup this evening.  You may return to a normal meal plan tomorrow.  Return to Work: You may return to work in one to two days or as instructed by your physician.  Special Instructions / Symptoms: Call your physician if any of  these symptoms occur:   -persistent or heavy bleeding  -bleeding which continues after first few urination  -large blood clots that are difficult to pass  -urine stream diminishes or stops completely  -fever equal to or higher than 101 degrees Farenheit.  -cloudy urine with a strong, foul odor  -severe pain  Females should always wipe from front to back after elimination.  You may feel some burning pain when you urinate.  This should disappear with time.  Applying  moist heat to the lower abdomen or a hot tub bath may help relieve the pain. \  Follow-Up / Date of Return Visit to Your Physician:  Post Anesthesia Home Care Instructions  Activity: Get plenty of rest for the remainder of the day. A responsible adult should stay with you for 24 hours following the procedure.  For the next 24 hours, DO NOT: -Drive a car -Paediatric nurse -Drink alcoholic beverages -Take any medication unless instructed by your physician -Make any legal decisions or sign important papers.  Meals: Start with liquid foods such as gelatin or soup. Progress to regular foods as tolerated. Avoid greasy, spicy, heavy foods. If nausea and/or vomiting occur, drink only clear liquids until the nausea and/or vomiting subsides. Call your physician if vomiting continues.  Special Instructions/Symptoms: Your throat may feel dry or sore from the anesthesia or the breathing tube placed in your throat during surgery. If this causes discomfort, gargle with warm salt water. The discomfort should disappear within 24 hours.  If you had a scopolamine patch placed behind your ear for the management of post- operative nausea and/or vomiting:  1. The medication in the patch is effective for 72 hours, after which it should be removed.  Wrap patch in a tissue and discard in the trash. Wash hands thoroughly with soap and water. 2. You may remove the patch earlier than 72 hours if you experience unpleasant side effects which may include dry mouth, dizziness or visual disturbances. 3. Avoid touching the patch. Wash your hands with soap and water after contact with the patch.    Call for an appointment to arrange follow-up.  Patient Signature:  ________________________________________________________  Nurse's Signature:  ________________________________________________________

## 2015-11-14 ENCOUNTER — Encounter (HOSPITAL_BASED_OUTPATIENT_CLINIC_OR_DEPARTMENT_OTHER): Payer: Self-pay | Admitting: Urology

## 2016-01-15 ENCOUNTER — Emergency Department (HOSPITAL_COMMUNITY): Payer: Medicare Other

## 2016-01-15 ENCOUNTER — Encounter (HOSPITAL_COMMUNITY): Payer: Self-pay | Admitting: Emergency Medicine

## 2016-01-15 ENCOUNTER — Emergency Department (HOSPITAL_COMMUNITY)
Admission: EM | Admit: 2016-01-15 | Discharge: 2016-01-15 | Disposition: A | Discharge status: Other Acute Inpatient Hospital | Payer: Medicare Other | Attending: Emergency Medicine | Admitting: Emergency Medicine

## 2016-01-15 DIAGNOSIS — R1084 Generalized abdominal pain: Secondary | ICD-10-CM | POA: Diagnosis present

## 2016-01-15 DIAGNOSIS — I10 Essential (primary) hypertension: Secondary | ICD-10-CM | POA: Insufficient documentation

## 2016-01-15 DIAGNOSIS — K8500 Idiopathic acute pancreatitis without necrosis or infection: Secondary | ICD-10-CM | POA: Insufficient documentation

## 2016-01-15 DIAGNOSIS — Z85828 Personal history of other malignant neoplasm of skin: Secondary | ICD-10-CM | POA: Diagnosis not present

## 2016-01-15 DIAGNOSIS — Z79899 Other long term (current) drug therapy: Secondary | ICD-10-CM | POA: Insufficient documentation

## 2016-01-15 LAB — URINALYSIS, ROUTINE W REFLEX MICROSCOPIC
BILIRUBIN URINE: NEGATIVE
GLUCOSE, UA: NEGATIVE mg/dL
HGB URINE DIPSTICK: NEGATIVE
KETONES UR: NEGATIVE mg/dL
Nitrite: NEGATIVE
PROTEIN: NEGATIVE mg/dL
Specific Gravity, Urine: 1.029 (ref 1.005–1.030)
pH: 6.5 (ref 5.0–8.0)

## 2016-01-15 LAB — COMPREHENSIVE METABOLIC PANEL
ALBUMIN: 3.7 g/dL (ref 3.5–5.0)
ALK PHOS: 79 U/L (ref 38–126)
ALT: 43 U/L (ref 14–54)
AST: 35 U/L (ref 15–41)
Anion gap: 10 (ref 5–15)
BILIRUBIN TOTAL: 1.6 mg/dL — AB (ref 0.3–1.2)
BUN: 12 mg/dL (ref 6–20)
CALCIUM: 9.2 mg/dL (ref 8.9–10.3)
CO2: 24 mmol/L (ref 22–32)
CREATININE: 0.58 mg/dL (ref 0.44–1.00)
Chloride: 102 mmol/L (ref 101–111)
GFR calc non Af Amer: >60 mL/min (ref >60)
GLUCOSE: 124 mg/dL — AB (ref 65–99)
Potassium: 3.7 mmol/L (ref 3.5–5.1)
SODIUM: 136 mmol/L (ref 135–145)
TOTAL PROTEIN: 6.5 g/dL (ref 6.5–8.1)

## 2016-01-15 LAB — CBC WITH DIFFERENTIAL/PLATELET
Basophils Absolute: 0 10*3/uL (ref 0.0–0.1)
Basophils Relative: 0 %
EOS PCT: 0 %
Eosinophils Absolute: 0 10*3/uL (ref 0.0–0.7)
HCT: 38.2 % (ref 36.0–46.0)
Hemoglobin: 13.6 g/dL (ref 12.0–15.0)
LYMPHS ABS: 1 10*3/uL (ref 0.7–4.0)
LYMPHS PCT: 10 %
MCH: 30 pg (ref 26.0–34.0)
MCHC: 35.6 g/dL (ref 30.0–36.0)
MCV: 84.3 fL (ref 78.0–100.0)
MONO ABS: 0.9 10*3/uL (ref 0.1–1.0)
Monocytes Relative: 9 %
Neutro Abs: 8.2 10*3/uL — ABNORMAL HIGH (ref 1.7–7.7)
Neutrophils Relative %: 81 %
PLATELETS: 183 10*3/uL (ref 150–400)
RBC: 4.53 MIL/uL (ref 3.87–5.11)
RDW: 11.7 % (ref 11.5–15.5)
WBC: 10.2 10*3/uL (ref 4.0–10.5)

## 2016-01-15 LAB — I-STAT CG4 LACTIC ACID, ED: Lactic Acid, Venous: 0.79 mmol/L (ref 0.5–2.0)

## 2016-01-15 LAB — I-STAT CHEM 8, ED
BUN: 10 mg/dL (ref 6–20)
CALCIUM ION: 1.21 mmol/L (ref 1.13–1.30)
CHLORIDE: 98 mmol/L — AB (ref 101–111)
Creatinine, Ser: 0.6 mg/dL (ref 0.44–1.00)
GLUCOSE: 115 mg/dL — AB (ref 65–99)
HCT: 41 % (ref 36.0–46.0)
Hemoglobin: 13.9 g/dL (ref 12.0–15.0)
Potassium: 3.7 mmol/L (ref 3.5–5.1)
Sodium: 137 mmol/L (ref 135–145)
TCO2: 24 mmol/L (ref 0–100)

## 2016-01-15 LAB — URINE MICROSCOPIC-ADD ON

## 2016-01-15 LAB — LIPASE, BLOOD: Lipase: 591 U/L — ABNORMAL HIGH (ref 11–51)

## 2016-01-15 MED ORDER — DIATRIZOATE MEGLUMINE & SODIUM 66-10 % PO SOLN
15.0000 mL | Freq: Once | ORAL | Status: AC
  Administered 2016-01-15: 15 mL via ORAL

## 2016-01-15 MED ORDER — SODIUM CHLORIDE 0.9 % IV BOLUS (SEPSIS)
1000.0000 mL | Freq: Once | INTRAVENOUS | Status: AC
  Administered 2016-01-15: 1000 mL via INTRAVENOUS

## 2016-01-15 MED ORDER — PIPERACILLIN-TAZOBACTAM 3.375 G IVPB 30 MIN
3.3750 g | Freq: Once | INTRAVENOUS | Status: AC
  Administered 2016-01-15: 3.375 g via INTRAVENOUS
  Filled 2016-01-15: qty 50

## 2016-01-15 MED ORDER — MORPHINE SULFATE (PF) 4 MG/ML IV SOLN
4.0000 mg | Freq: Once | INTRAVENOUS | Status: AC
  Administered 2016-01-15: 4 mg via INTRAVENOUS
  Filled 2016-01-15: qty 1

## 2016-01-15 MED ORDER — SODIUM CHLORIDE 0.9 % IV BOLUS (SEPSIS)
1000.0000 mL | Freq: Once | INTRAVENOUS | Status: AC
Start: 2016-01-15 — End: 2016-01-15
  Administered 2016-01-15: 1000 mL via INTRAVENOUS

## 2016-01-15 MED ORDER — IOPAMIDOL (ISOVUE-300) INJECTION 61%
100.0000 mL | Freq: Once | INTRAVENOUS | Status: AC | PRN
  Administered 2016-01-15: 100 mL via INTRAVENOUS

## 2016-01-15 MED ORDER — ONDANSETRON HCL 4 MG/2ML IJ SOLN
4.0000 mg | Freq: Once | INTRAMUSCULAR | Status: AC
  Administered 2016-01-15: 4 mg via INTRAVENOUS
  Filled 2016-01-15: qty 2

## 2016-01-15 MED ORDER — HYDROMORPHONE HCL 1 MG/ML IJ SOLN
1.0000 mg | Freq: Once | INTRAMUSCULAR | Status: AC
  Administered 2016-01-15: 1 mg via INTRAVENOUS
  Filled 2016-01-15: qty 1

## 2016-01-15 NOTE — ED Notes (Signed)
Patient reports that since Friday she has had "really bad stomach pains" Reports nausea. Denies vomiting, diarrhea, fever.

## 2016-01-15 NOTE — ED Notes (Signed)
Made Dr Tyrone Nine aware of patient's request for pain meds.

## 2016-01-15 NOTE — ED Notes (Signed)
MD at bedside. 

## 2016-01-15 NOTE — ED Provider Notes (Addendum)
CSN: [id number]     Arrival date & time 01/15/16  [id number] History   First MD Initiated Contact with Patient 01/15/16 [phone]     Chief Complaint  Patient presents with  . Abdominal Pain     (Consider location/radiation/quality/duration/timing/severity/associated sxs/prior Treatment) Patient is a 70 y.o. female presenting with abdominal pain.  Abdominal Pain Pain location:  Generalized Pain quality: cramping, sharp and shooting   Pain radiates to:  Does not radiate Pain severity:  Severe Onset quality:  Gradual Duration:  2 days Timing:  Constant Progression:  Worsening Chronicity:  New Relieved by:  Nothing Worsened by:  Nothing tried Ineffective treatments:  None tried Associated symptoms: nausea   Associated symptoms: no chest pain, no chills, no dysuria, no fever, no shortness of breath and no vomiting    70 yo F With a chief complaint of diffuse abdominal pain. Going on for the past couple days. No history of similar symptoms. Pain is achy sharp and crampy. Denies radiation. Worse in the upper abdomen. Denies urinary symptoms. Denies vaginal bleeding or discharge. Having some lower back pain with this as well. Denies fevers or chills. Has a history of a Whipple that was done about a year ago. 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 She is also been found to have diabetes and was started on insulin as well as metformin. She took one metformin pill just prior to the symptoms. Was told to stop the tablet however has continued to have significant tenderness. Unsure if anything makes it better or worse.  Past Medical History  Diagnosis Date  . Hypertension   . Hyperlipidemia   . Kidney filling defect     right  . Gross hematuria   . GERD (gastroesophageal reflux disease)     mild- controls with diet  . History of melanoma excision     left thigh 1989  . Heart murmur   . History of atrial fibrillation without current medication currently 09-18-2015 pt  denies S & S    post op 04-14-2015 whipple procedure (per pt had work-up done by [facility] cardiologist- pt was released from cardiology care -- no meds or asa)  . Hiatal hernia   . History of pancreatic surgery     neuroendocrine tumor of pancreas--  benign non-cancerous  s/p  whipple  . History of colon polyps     hyperplastic  . History of melanoma excision     1989 left thigh  . History of basal cell carcinoma excision     multiple skin removal   Past Surgical History  Procedure Laterality Date  . Eus N/A 02/22/2015    Procedure: UPPER ENDOSCOPIC ULTRASOUND (EUS) RADIAL;  Surgeon: Arta Silence, MD;  Location: [facility];  Service: Endoscopy;  Laterality: N/A;  . Cataract extraction w/ intraocular lens  implant, bilateral  2010  . Pulley release right thumb  02-01-2005  . Lumbar disc surgery  2000    L4 - L5  . Pancreaticoduodenectomy  04-14-2015   at [facility]    non-cancerous neuroendocrine pancreatic tumor  . Colonoscopy  last one 2015  . Melanoma excision  1989    left thigh  . Cystoscopy with retrograde pyelogram, ureteroscopy and stent placement Right 09/22/2015    Procedure: CYSTOSCOPY WITH RIGHT RETROGRADE PYELOGRAM, URETEROSCOPY AND STENT PLACEMENT, BIOPSY;  Surgeon: Kathie Rhodes, MD;  Location: [place];  Service: Urology;  Laterality: Right;  . Cystoscopy with retrograde pyelogram, ureteroscopy and stent placement Right 11/13/2015  Procedure: CYSTOSCOPY WITH RIGHT RETROGRADE PYELOGRAM, URETEROSCOPY BIOPSY, FULGURATION  AND STENT ;  Surgeon: Kathie Rhodes, MD;  Location: [place];  Service: Urology;  Laterality: Right;   No family history on file. Social History  Substance Use Topics  . Smoking status: Never Smoker   . Smokeless tobacco: Never Used  . Alcohol Use: Yes     Comment: occasional   OB History    No data available     Review of Systems  Constitutional: Negative for fever and chills.  HENT: Negative for congestion and  rhinorrhea.   Eyes: Negative for redness and visual disturbance.  Respiratory: Negative for shortness of breath and wheezing.   Cardiovascular: Negative for chest pain and palpitations.  Gastrointestinal: Positive for nausea and abdominal pain. Negative for vomiting.  Genitourinary: Negative for dysuria and urgency.  Musculoskeletal: Negative for myalgias and arthralgias.  Skin: Negative for pallor and wound.  Neurological: Negative for dizziness and headaches.      Allergies  Metformin and related; Tricor; and Erythromycin  Home Medications   Prior to Admission medications   Medication Sig Start Date End Date Taking? Authorizing Provider  acetaminophen (TYLENOL) 500 MG tablet Take 1,000 mg by mouth every 6 (six) hours as needed for moderate pain or headache.   Yes Historical Provider, MD  Ascorbic Acid (VITAMIN C) 500 MG CAPS Take 1 capsule by mouth every morning.   Yes Historical Provider, MD  Biotin 5000 MCG TABS Take 1 tablet by mouth every evening.   Yes Historical Provider, MD  Calcium Carb-Cholecalciferol (CALCIUM 600 + D PO) Take 2 tablets by mouth daily.   Yes Historical Provider, MD  Cholecalciferol (VITAMIN D) 2000 UNITS CAPS Take 1 capsule by mouth 2 (two) times daily.   Yes Historical Provider, MD  citalopram (CELEXA) 10 MG tablet Take 10 mg by mouth every morning.   Yes Historical Provider, MD  hydrochlorothiazide (MICROZIDE) 12.5 MG capsule Take 12.5 mg by mouth every morning.   Yes Historical Provider, MD  lisinopril (PRINIVIL,ZESTRIL) 40 MG tablet Take 40 mg by mouth every morning.   Yes Historical Provider, MD  Magnesium 400 MG CAPS Take 1 capsule by mouth daily.   Yes Historical Provider, MD  metoprolol succinate (TOPROL-XL) 50 MG 24 hr tablet Take 50 mg by mouth every morning. Take with or immediately following a meal.   Yes Historical Provider, MD  Multiple Vitamin (MULTIVITAMIN WITH MINERALS) TABS tablet Take 1 tablet by mouth every morning.   Yes Historical  Provider, MD  omega-3 acid ethyl esters (LOVAZA) 1 G capsule Take 2 g by mouth 2 (two) times daily.   Yes Historical Provider, MD  pantoprazole (PROTONIX) 40 MG tablet Take 40 mg by mouth every morning.   Yes Historical Provider, MD  PRESCRIPTION MEDICATION Patient states she has insulin samples from the doctor but has no idea what the name of it is   Yes Historical Provider, MD  simvastatin (ZOCOR) 40 MG tablet Take 40 mg by mouth at bedtime.   Yes Historical Provider, MD  HYDROcodone-acetaminophen (NORCO) 10-325 MG tablet Take 1-2 tablets by mouth every 4 (four) hours as needed for moderate pain. Maximum dose per 24 hours - 8 pills Patient not taking: Reported on 01/15/2016 09/22/15   Kathie Rhodes, MD  HYDROcodone-acetaminophen [facility]) 10-325 MG tablet Take 1-2 tablets by mouth every 4 (four) hours as needed for moderate pain. Maximum dose per 24 hours - 8 pills Patient not taking: Reported on 01/15/2016 11/13/15   Elta Guadeloupe  Karsten Ro, MD  phenazopyridine (PYRIDIUM) 200 MG tablet Take 1 tablet (200 mg total) by mouth 3 (three) times daily as needed for pain. Patient not taking: Reported on 01/15/2016 09/22/15   Kathie Rhodes, MD  phenazopyridine (PYRIDIUM) 200 MG tablet Take 1 tablet (200 mg total) by mouth 3 (three) times daily as needed for pain. Patient not taking: Reported on 01/15/2016 11/13/15   Kathie Rhodes, MD   BP 132/76 mmHg  Pulse 79  Temp(Src) 98.4 F (36.9 C) (Oral)  Resp 18  Ht 5\' 2"  (1.575 m)  Wt 126 lb (57.153 kg)  BMI 23.04 kg/m2  SpO2 96% Physical Exam  Constitutional: She is oriented to person, place, and time. She appears well-developed and well-nourished. No distress.  HENT:  Head: Normocephalic and atraumatic.  Eyes: EOM are normal. Pupils are equal, round, and reactive to light.  Neck: Normal range of motion. Neck supple.  Cardiovascular: Normal rate and regular rhythm.  Exam reveals no gallop and no friction rub.   No murmur heard. Pulmonary/Chest: Effort normal. She has no  wheezes. She has no rales.  Abdominal: Soft. She exhibits no distension. There is tenderness (tenderness throughout the abdomen the worst in the left upper quadrant and epigastrium). There is no rebound and no guarding.  Musculoskeletal: She exhibits no edema or tenderness.  Neurological: She is alert and oriented to person, place, and time.  Skin: Skin is warm and dry. She is not diaphoretic.  Psychiatric: She has a normal mood and affect. Her behavior is normal.  Nursing note and vitals reviewed.   ED Course  Procedures (including critical care time) Labs Review Labs Reviewed  COMPREHENSIVE METABOLIC PANEL - Abnormal; Notable for the following:    Glucose, Bld 124 (*)    Total Bilirubin 1.6 (*)    All other components within normal limits  LIPASE, BLOOD - Abnormal; Notable for the following:    Lipase 591 (*)    All other components within normal limits  CBC WITH DIFFERENTIAL/PLATELET - Abnormal; Notable for the following:    Neutro Abs 8.2 (*)    All other components within normal limits  URINALYSIS, ROUTINE W REFLEX MICROSCOPIC (NOT AT [facility]) - Abnormal; Notable for the following:    Leukocytes, UA MODERATE (*)    All other components within normal limits  URINE MICROSCOPIC-ADD ON - Abnormal; Notable for the following:    Squamous Epithelial / LPF 0-5 (*)    Bacteria, UA RARE (*)    All other components within normal limits  I-STAT CHEM 8, ED - Abnormal; Notable for the following:    Chloride 98 (*)    Glucose, Bld 115 (*)    All other components within normal limits  I-STAT CG4 LACTIC ACID, ED    Imaging Review Ct Abdomen Pelvis W Contrast  01/15/2016  CLINICAL DATA:  70 year old female with bad abdominal pain for the past 3 days. Nausea. Vomiting, diarrhea and fever. History of Whipple procedure 1 year ago for a benign pancreatic tumor. EXAM: CT ABDOMEN AND PELVIS WITH CONTRAST TECHNIQUE: Multidetector CT imaging of the abdomen and pelvis was performed using the standard  protocol following bolus administration of intravenous contrast. CONTRAST:  154mL ISOVUE-300 IOPAMIDOL (ISOVUE-300) INJECTION 61% COMPARISON:  CT the abdomen and pelvis 08/30/2015. FINDINGS: Lower chest:  Unremarkable. Hepatobiliary: Diffuse low attenuation throughout the hepatic parenchyma, compatible with severe hepatic steatosis. With focal fatty sparing adjacent to the gallbladder fossa incidentally noted. No definite cystic or solid hepatic lesions. No intra or extrahepatic biliary ductal  dilatation. Status post cholecystectomy. Pancreas: Postoperative changes of Whipple procedure are noted. When viewed on coronal images 41-53 of series 3 there is a potential breakdown of the pancreaticoduodenostomy anastomosis, best visualized on image 45 of series 3. Immediately inferior to this there are small collections of low-attenuation material which are likely fluid, largest of which measures 1.4 x 1.6 x 1.9 cm (axial image 27 of series 2 and coronal image 48 of series 3), with surrounding inflammatory changes. Multiple borderline enlarged and mildly enlarged lymph nodes are also noted in this region measuring up to 11 mm in short axis (image 55 of series 3). The adjacent duodenum appears thickened, and there are inflammatory changes encompassing the first, second and third portions of the duodenum and the surrounding mesenteric soft tissues. The remaining body and tail of the pancreas are generally unremarkable in appearance, although there is mild diffuse pancreatic ductal ectasia which measures up to 4 mm in the body of the pancreas. Spleen: Unremarkable. Adrenals/Urinary Tract: Sub cm low-attenuation lesion in the lower pole the right kidney is too small to definitively characterize, but statistically likely tiny cysts. Left kidney and bilateral adrenal glands are normal in appearance. There is some very mild right-sided hydroureteronephrosis. This extends into the distal third of the right ureter, without a  definite obstructing stone or other cause identified. No left hydroureteronephrosis. Urinary bladder is low lying, compatible with a cystocele. Urinary bladder is otherwise unremarkable in appearance. Stomach/Bowel: Postoperative changes of Whipple procedure. As discussed above, there are inflammatory changes associated with the duodenum. No pathologic dilatation of small bowel or colon. Normal appendix. Vascular/Lymphatic: Atherosclerosis throughout the abdominal and pelvic vasculature, without evidence of aneurysm or dissection. 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 Narrowing of the splenoportal confluence (also similar to the other postoperative examination). Portal vein remains patent. Circumaortic left renal vein (normal anatomical variant) incidentally noted. Notably, the anterior branch of the circumaortic left renal vein appears narrowed secondary to mass effect from the inflammatory process inferior to the pancreaticoduodenostomy. Multiple borderline enlarged and mildly enlarged mesenteric lymph nodes in the root of the small bowel mesentery adjacent to the inferior aspect of the pancreaticoduodenostomy anastomosis, measuring up to 11 mm. Reproductive: Uterus and ovaries are unremarkable in appearance. Other: Inflammatory changes including soft tissue stranding in the root of the small bowel mesenteries and in the adjacent retroperitoneum. No large volume of ascites. No pneumoperitoneum. Musculoskeletal: There are no aggressive appearing lytic or blastic lesions noted in the visualized portions of the skeleton. IMPRESSION: 1. Extensive inflammatory changes inferior to the pancreaticoduodenostomy in this patient status post Whipple procedure. On some images, the anastomotic site has an  appearance concerning for potential breakdown, likely with leakage of pancreatic secretions into the adjacent retroperitoneum, with what appear to be phlegmonous changes on today's examination. This is associated with some reactive lymphadenopathy, this is associated with some reactive lymphadenopathy. Surgical consultation is recommended. 2. Severe hepatic steatosis. 3. Normal appendix. 4. Cystocele. 5. Atherosclerosis. 6. Additional incidental findings, as above. Electronically Signed   By: Vinnie Langton M.D.   On: 01/15/2016 12:11   I have personally reviewed and evaluated these images and lab results as part of my medical decision-making.   EKG Interpretation None      MDM   Final diagnoses:  Idiopathic acute pancreatitis    70 yo  F With diffuse abdominal pain. On my exam it's worse in the left upper and epigastrium. Will obtain a CT scan of the abdomen and pelvis contrast.  CT scan is concerning for possible leakage at the anastomosis with pancreatic contents spilling into the abdomen. Discussed with Dr. Hassell Done, general surgery. As her surgery was performed at [facility] we'll contact them. We will transfer the patient [facility]. Given Zosyn.  CRITICAL CARE Performed by: Cecilio Asper   Total critical care time: 30 minutes  Critical care time was exclusive of separately billable procedures and treating other patients.  Critical care was necessary to treat or prevent imminent or life-threatening deterioration.  Critical care was time spent personally by me on the following activities: development of treatment plan with patient and/or surrogate as well as nursing, discussions with consultants, evaluation of patient's response to treatment, examination of patient, obtaining history from patient or surrogate, ordering and performing treatments and interventions, ordering and review of laboratory studies, ordering and review of radiographic studies, pulse oximetry and re-evaluation of  patient's condition.   The patients results and plan were reviewed and discussed.   Any x-rays performed were independently reviewed by myself.   Differential diagnosis were considered with the presenting HPI.  Medications  HYDROmorphone (DILAUDID) injection 1 mg (not administered)  ondansetron (ZOFRAN) injection 4 mg (not administered)  morphine 4 MG/ML injection 4 mg (4 mg Intravenous Given 01/15/16 1035)  ondansetron (ZOFRAN) injection 4 mg (4 mg Intravenous Given 01/15/16 1035)  sodium chloride 0.9 % bolus 1,000 mL (0 mLs Intravenous Stopped 01/15/16 1104)  diatrizoate meglumine-sodium (GASTROGRAFIN) 66-10 % solution 15 mL (15 mLs Oral Given 01/15/16 0941)  iopamidol (ISOVUE-300) 61 % injection 100 mL (100 mLs Intravenous Contrast Given 01/15/16 1120)  sodium chloride 0.9 % bolus 1,000 mL (0 mLs Intravenous Stopped 01/15/16 1331)  piperacillin-tazobactam (ZOSYN) IVPB 3.375 g (0 g Intravenous Stopped 01/15/16 1408)    Filed Vitals:   01/15/16 1100 01/15/16 1152 01/15/16 1300 01/15/16 1410  BP: 144/69 116/71 135/65 132/76  Pulse: 66 71 67 79  Temp:    98.4 F (36.9 C)  TempSrc:    Oral  Resp: 18 18 18 18   Height:      Weight:      SpO2: 98% 96% 97% 96%    Final diagnoses:  Idiopathic acute pancreatitis    Transfer was discussed with the admitting physician, patient and/or family and they are comfortable with the plan.    Deno Etienne, DO 01/15/16 [place], DO 01/15/16 1442

## 2016-01-15 NOTE — ED Notes (Signed)
[facility] at bedside 

## 2016-01-15 NOTE — ED Notes (Signed)
Patient transported to CT 

## 2016-01-15 NOTE — ED Notes (Addendum)
Patient stated that she has attempted to give urine sample and was unsucessful. Patient will try again later. Patient states that she believes she is dehydrated and needs more fluids. RN notified

## 2016-01-23 ENCOUNTER — Encounter: Payer: Medicare Other | Attending: Family Medicine | Admitting: Skilled Nursing Facility1

## 2016-01-23 ENCOUNTER — Encounter: Payer: Self-pay | Admitting: Skilled Nursing Facility1

## 2016-01-23 VITALS — Ht 62.0 in | Wt 124.0 lb

## 2016-01-23 DIAGNOSIS — E119 Type 2 diabetes mellitus without complications: Secondary | ICD-10-CM

## 2016-01-23 DIAGNOSIS — Z713 Dietary counseling and surveillance: Secondary | ICD-10-CM | POA: Insufficient documentation

## 2016-01-23 NOTE — Progress Notes (Signed)

## 2016-01-30 ENCOUNTER — Encounter: Payer: Medicare Other | Admitting: *Deleted

## 2016-01-30 DIAGNOSIS — E119 Type 2 diabetes mellitus without complications: Secondary | ICD-10-CM

## 2016-01-30 DIAGNOSIS — Z713 Dietary counseling and surveillance: Secondary | ICD-10-CM | POA: Diagnosis not present

## 2016-01-30 NOTE — Progress Notes (Signed)

## 2016-02-06 ENCOUNTER — Encounter: Payer: Medicare Other | Admitting: Skilled Nursing Facility1

## 2016-02-06 DIAGNOSIS — Z713 Dietary counseling and surveillance: Secondary | ICD-10-CM | POA: Diagnosis not present

## 2016-02-06 DIAGNOSIS — E119 Type 2 diabetes mellitus without complications: Secondary | ICD-10-CM

## 2016-02-06 NOTE — Progress Notes (Signed)
Patient was seen on 02/06/2016 for the third of a series of three diabetes self-management courses at the [facility]. The following learning objectives were met by the patient during this class:  . State the amount of activity recommended for healthy living . Describe activities suitable for individual needs . Identify ways to regularly incorporate activity into daily life . Identify barriers to activity and ways to over come these barriers  Identify diabetes medications being personally used and their primary action for lowering glucose and possible side effects . Describe role of stress on blood glucose and develop strategies to address psychosocial issues . Identify diabetes complications and ways to prevent them  Explain how to manage diabetes during illness . Evaluate success in meeting personal goal . Establish 2-3 goals that they will plan to diligently work on until they return for the  46-monthfollow-up visit  Goals:   I will count my carb choices at most meals and snacks  Keep a meal log  I will be active 150 minutes or per week  Your patient has identified these potential barriers to change:  Motivation  Your patient has identified their diabetes self-care support plan as  Unanswered  Plan:  Attend Monthly Diabetes Support Group as needed or make a future follow up appointment

## 2016-02-07 ENCOUNTER — Encounter: Payer: Self-pay | Admitting: Family Medicine

## 2016-03-20 ENCOUNTER — Other Ambulatory Visit: Payer: Self-pay | Admitting: Urology

## 2016-06-07 ENCOUNTER — Other Ambulatory Visit: Payer: Self-pay | Admitting: Internal Medicine

## 2016-06-07 DIAGNOSIS — R0989 Other specified symptoms and signs involving the circulatory and respiratory systems: Secondary | ICD-10-CM

## 2016-06-11 ENCOUNTER — Encounter (HOSPITAL_BASED_OUTPATIENT_CLINIC_OR_DEPARTMENT_OTHER): Payer: Self-pay | Admitting: *Deleted

## 2016-06-12 ENCOUNTER — Encounter (HOSPITAL_BASED_OUTPATIENT_CLINIC_OR_DEPARTMENT_OTHER): Payer: Self-pay | Admitting: *Deleted

## 2016-06-12 NOTE — Progress Notes (Signed)
Pt instructed npo pmn 10/29 x protomix, celexa and metoprolol w sip of water.  To [facility] 10/30 @ 0600.  Needs istat on arrival.  ekg in epic

## 2016-06-16 NOTE — H&P (Signed)
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  The kidney stone is on the right side. Her cancer was diagnosed 10/02/2015. Her cancer was. She has had fulgurated to treat her transitional cell carcinoma. She has not received radiation therapy. She did not receive chemotherapy for her cancer.   She did not see blood in her urine. She is not having pain in new locations. She has not recently had unwanted weight loss.   Her last radiologic test to evaluate the kidneys was 02/29/2016.   She has not had any hematuria or new voiding symptoms.     ALLERGIES: Erythromycin TABS metformin Tricor TABS    MEDICATIONS: Hydrochlorothiazide  Acetaminophen 1 PO Daily  Aspirin 81 MG TABS Oral  Biotin CAPS Oral  Calcium + D TABS Oral  CeleXA 10 MG Oral Tablet Oral  Glimepiride  Lisinopril 20 MG Oral Tablet Oral  Lovaza 1 GM Oral Capsule Oral  Magnesium CAPS Oral  Metoprolol Tartrate 50 MG Oral Tablet Oral  Multi-Vitamin TABS Oral  Pantoprazole Sodium 40 MG Oral Tablet Delayed Release Oral  Simvastatin 40 MG Oral Tablet Oral  Triamterene-HCTZ 37.5-25 MG Oral Tablet Oral  Vitamin C 500 MG Oral Tablet Oral  Vitamin D 50000 UNIT CAPS Oral     GU PSH: Cysto Uretero Biopsy Fulgura - 11/15/2015, 10/03/2015 Cysto Uretero W/excise Tumor - 10/03/2015 Cystoscopy Insert Stent - 10/03/2015 Locm 300-399Mg /Ml Iodine,1Ml - 02/29/2016      PSH Notes: Cystoscopy With Ureteroscopy With Fulguration Of Lesion, Cystoscopy With Ureteroscopy For Biopsy Right, Cystoscopy With Insertion Of Ureteral Stent Right, Cystoscopy With Resection Of Tumor, Radical Pancreaticoduodenectomy, Back Surgery   NON-GU PSH: None   GU PMH: Kidney Cancer Right, except renal pelvis, Transitional cell carcinoma of kidney, right - 11/20/2015 Abnormal radiologic findings on diagnositic imaging of unspecified kidney, Kidney filling defect - 123456 Duplication of  ureter, Ureteral duplication, right - 123456 Renal Cysts, Simple, Bilateral renal cysts - 09/07/2015 Cystocele, Unspec, Cystocele - 08/24/2015      PMH Notes: Right upper pole calyceal TCCa: She experienced intermittent gross hematuria with no discomfort and no risk factors identified.  Workup in 6/16: CT scan, cystoscopy and cytology - negative.  CT scan [id number] at [facility] - duplicated system on the right hand side with a 2.1 x 2.4 cm stone in her bladder described as being in an inferior cystocele.  I reviewed the CT scan images from [facility] which revealed a density in what appeared to be a cystocele. It appeared to be quite large and was seen on axial images only as this area was cut off on sagittal and coronal imaging.  Cystoscopy 8/16 - large cystocele, no bladder stones and no bladder lesions.  She was found to have blood coming from her right UO and underwent ureteroscopy, biopsy and fulguration of a high-grade papillary transitional cell carcinoma on 10/02/15.  Pathology: Transitional cell carcinoma with no evidence of invasion (Ta,G3)  Relook 11/13/15 - no definite papillary tumor identified. The area made biopsy difficult and the small volume of her renal pelvis making barbotage difficult and both of these tests were negative but there was minimal specimen.   Bilateral renal cyst: These were incidentally noted on CT scan in 6/16.   Cystocele: She has moderate pelvic prolapse that actually could be seen on her CT scan. She is asymptomatic from this.     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 Encounter for general adult medical examination without abnormal findings, Encounter for preventive health examination - 02/14/2015 Personal history of colonic polyps, History of colonic polyps - 2015-02-19 Personal history of malignant melanoma of skin, History of malignant melanoma - 19-Feb-2015 Personal history of other diseases of  the circulatory system, History of hypertension - February 19, 2015 Personal history of other diseases of the digestive system, History of esophageal reflux - 02-19-2015 Personal history of other diseases of the musculoskeletal system and connective tissue, History of osteopenia - 02/19/15 Personal history of other endocrine, nutritional and metabolic disease, History of hyperlipidemia - Feb 19, 2015, History of hypercholesterolemia, - February 19, 2015 Personal history of other malignant neoplasm of skin, History of basal cell carcinoma - 02/19/2015 Personal history of other mental and behavioral disorders, History of depression - 02/19/2015 Vitamin D deficiency, unspecified, Vitamin D deficiency - Feb 19, 2015    FAMILY HISTORY: Deceased - Runs In Family Hypertension - Runs In Family liver cancer - Runs In Family renal failure - Runs In Family   SOCIAL HISTORY: Marital Status: Married Current Smoking Status: Patient has never smoked.  Social Drinker.  Drinks 1 caffeinated drink per day.     Notes: Caffeine use, Never a smoker, Two children, Alcohol use, Married   REVIEW OF SYSTEMS:    GU Review Female:   Patient denies hard to postpone urination, currently pregnant, get up at night to urinate, frequent urination, burning /pain with urination, stream starts and stops, have to strain to urinate, trouble starting your stream, and leakage of urine.  Gastrointestinal (Upper):   Patient denies nausea, vomiting, and indigestion/ heartburn.  Gastrointestinal (Lower):   Patient denies diarrhea and constipation.  Constitutional:   Patient denies fever, night sweats, weight loss, and fatigue.  Skin:   Patient denies skin rash/ lesion and itching.  Eyes:   Patient denies blurred vision and double vision.  Ears/ Nose/ Throat:   Patient denies sore throat and sinus problems.  Hematologic/Lymphatic:   Patient denies swollen glands and easy bruising.  Cardiovascular:   Patient denies leg swelling and chest pains.   Respiratory:   Patient denies cough and shortness of breath.  Endocrine:   Patient denies excessive thirst.  Musculoskeletal:   Patient reports back pain and joint pain.   Neurological:   Patient denies headaches and dizziness.  Psychologic:   Patient denies depression and anxiety.   VITAL SIGNS:    Weight 125 lb / 56.7 kg  Height 62 in / 157.48 cm  BP 120/64 mmHg  Pulse 56 /min  BMI 22.9 kg/m   Physical Exam  Constitutional: Well nourished and well developed . No acute distress.    ENT: The ears and nose are normal in appearance.    Neck: The appearance of the neck is normal and no neck mass is present.    Pulmonary: No respiratory distress and normal respiratory rhythm and effort.    Cardiovascular: Heart rate and rhythm are normal . No peripheral edema.    Abdomen: The abdomen is soft and nontender. No masses are palpated. No CVA tenderness. No hernias are palpable. No hepatosplenomegaly noted.    Lymphatics: The femoral and inguinal nodes are not enlarged or tender.    Skin: Normal skin turgor, no visible rash and no visible skin lesions.    Neuro/Psych: Mood and affect are appropriate.    PAST DATA REVIEWED:  Source Of History:  Patient, Outside Source  Records Review:   Previous Patient Records, POC Tool  X-Ray Review: C.T. Abdomen/Pelvis: Reviewed Films.  Reviewed Report. Discussed With Patient. No new lesions were identified within the upper pole was an area of slight enhancement of questionable significance.    PROCEDURES:          Urinalysis w/Scope - 81001 Dipstick Dipstick Cont'd Micro  Specimen: Voided Bilirubin: Neg WBC/hpf: 0-5/hpf  Color: Yellow Ketones: Neg RBC/hpf: 0-2/hpf  Appearance: Clear Blood: Neg Bacteria: Rare  Specific Gravity: 1.025 Protein: Neg Cystals: NS (Not Seen)  pH: 6.0 Urobilinogen: 0.2 Casts: NS (Not Seen)  Glucose: Trace Nitrites: Neg Trichomonas: Not Present    Leukocyte Esterase: 2+ Mucous: Not Present      Epithelial  Cells: 0-5/hpf      Yeast: NS (Not Seen)      Sperm: Not Present    ASSESSMENT:      ICD-10 Details  1 GU:   Kidney Cancer Right, except renal pelvis - C64.1 Stable - We discussed the results of her CT scan images revealed no new filling defects to suggest a papillary lesions within the upper pole of her right kidney. While the did appear to be some areas of increased enhancement this may be due to the fulguration I did in that area. What I have recommended that at this point is that we obtain a urine cytology and follow this closely. We discussed the options of continued observation with CT scan versus ureteroscopy. Since there is no definite lesion and goal is to prevent progression while hopefully avoiding a nephroureterectomy we have decided to proceed as we had discussed previously with repeat right ureteroscopy and biopsy versus fulguration.    I told her I would also evaluate the area in her ureter noted on CT scan although I think this is a nonspecific finding.   A urine was sent for cytology which was negative.   PLAN: Cystoscopy, right retrograde pyelogram, right ureteroscopy and possible biopsy versus fulguration.

## 2016-06-17 ENCOUNTER — Ambulatory Visit (HOSPITAL_BASED_OUTPATIENT_CLINIC_OR_DEPARTMENT_OTHER): Payer: Medicare Other | Admitting: Anesthesiology

## 2016-06-17 ENCOUNTER — Encounter (HOSPITAL_BASED_OUTPATIENT_CLINIC_OR_DEPARTMENT_OTHER)
Admission: RE | Disposition: A | Discharge status: Home / Self Care | Payer: Self-pay | Source: Ambulatory Visit | Attending: Urology

## 2016-06-17 ENCOUNTER — Encounter (HOSPITAL_BASED_OUTPATIENT_CLINIC_OR_DEPARTMENT_OTHER): Payer: Self-pay

## 2016-06-17 ENCOUNTER — Ambulatory Visit (HOSPITAL_BASED_OUTPATIENT_CLINIC_OR_DEPARTMENT_OTHER)
Admission: RE | Admit: 2016-06-17 | Discharge: 2016-06-17 | Disposition: A | Discharge status: Home / Self Care | Payer: Medicare Other | Source: Ambulatory Visit | Attending: Urology | Admitting: Urology

## 2016-06-17 DIAGNOSIS — Z8553 Personal history of malignant neoplasm of renal pelvis: Secondary | ICD-10-CM

## 2016-06-17 DIAGNOSIS — C641 Malignant neoplasm of right kidney, except renal pelvis: Secondary | ICD-10-CM | POA: Insufficient documentation

## 2016-06-17 DIAGNOSIS — I10 Essential (primary) hypertension: Secondary | ICD-10-CM | POA: Insufficient documentation

## 2016-06-17 DIAGNOSIS — E119 Type 2 diabetes mellitus without complications: Secondary | ICD-10-CM | POA: Insufficient documentation

## 2016-06-17 DIAGNOSIS — K219 Gastro-esophageal reflux disease without esophagitis: Secondary | ICD-10-CM | POA: Insufficient documentation

## 2016-06-17 HISTORY — PX: CYSTOSCOPY WITH URETEROSCOPY AND STENT PLACEMENT: SHX6377

## 2016-06-17 HISTORY — DX: Personal history of other diseases of the digestive system: Z87.19

## 2016-06-17 LAB — POCT I-STAT 4, (NA,K, GLUC, HGB,HCT)
Glucose, Bld: 139 mg/dL — ABNORMAL HIGH (ref 65–99)
HCT: 34 % — ABNORMAL LOW (ref 36.0–46.0)
Hemoglobin: 11.6 g/dL — ABNORMAL LOW (ref 12.0–15.0)
Potassium: 3 mmol/L — ABNORMAL LOW (ref 3.5–5.1)
Sodium: 145 mmol/L (ref 135–145)

## 2016-06-17 LAB — GLUCOSE, CAPILLARY: Glucose-Capillary: 144 mg/dL — ABNORMAL HIGH (ref 65–99)

## 2016-06-17 SURGERY — CYSTOURETEROSCOPY, WITH STENT INSERTION
Anesthesia: General | Laterality: Right

## 2016-06-17 MED ORDER — CIPROFLOXACIN IN D5W 200 MG/100ML IV SOLN
200.0000 mg | INTRAVENOUS | Status: AC
  Administered 2016-06-17: 200 mg via INTRAVENOUS
  Filled 2016-06-17: qty 100

## 2016-06-17 MED ORDER — PHENAZOPYRIDINE HCL 100 MG PO TABS
ORAL_TABLET | ORAL | Status: AC
  Filled 2016-06-17: qty 2

## 2016-06-17 MED ORDER — DEXAMETHASONE SODIUM PHOSPHATE 10 MG/ML IJ SOLN
INTRAMUSCULAR | Status: AC
  Filled 2016-06-17: qty 1

## 2016-06-17 MED ORDER — FENTANYL CITRATE (PF) 100 MCG/2ML IJ SOLN
INTRAMUSCULAR | Status: DC | PRN
  Administered 2016-06-17: 50 ug via INTRAVENOUS

## 2016-06-17 MED ORDER — PROPOFOL 10 MG/ML IV BOLUS
INTRAVENOUS | Status: DC | PRN
Start: 2016-06-17 — End: 2016-06-17
  Administered 2016-06-17: 150 mg via INTRAVENOUS

## 2016-06-17 MED ORDER — PHENAZOPYRIDINE HCL 200 MG PO TABS: 200.0000 mg | ORAL_TABLET | Freq: Three times a day (TID) | ORAL | 0 refills | Status: DC | PRN

## 2016-06-17 MED ORDER — FENTANYL CITRATE (PF) 100 MCG/2ML IJ SOLN
INTRAMUSCULAR | Status: AC
  Filled 2016-06-17: qty 2

## 2016-06-17 MED ORDER — PROPOFOL 500 MG/50ML IV EMUL
INTRAVENOUS | Status: AC
  Filled 2016-06-17: qty 50

## 2016-06-17 MED ORDER — LACTATED RINGERS IV SOLN
INTRAVENOUS | Status: DC
  Administered 2016-06-17: 07:00:00 via INTRAVENOUS
  Filled 2016-06-17: qty 1000

## 2016-06-17 MED ORDER — STERILE WATER FOR IRRIGATION IR SOLN
Status: DC | PRN
  Administered 2016-06-17: 6000 mL

## 2016-06-17 MED ORDER — FENTANYL CITRATE (PF) 100 MCG/2ML IJ SOLN
25.0000 ug | INTRAMUSCULAR | Status: DC | PRN
  Filled 2016-06-17: qty 1

## 2016-06-17 MED ORDER — LIDOCAINE 2% (20 MG/ML) 5 ML SYRINGE
INTRAMUSCULAR | Status: DC | PRN
  Administered 2016-06-17: 50 mg via INTRAVENOUS

## 2016-06-17 MED ORDER — HYDROCODONE-ACETAMINOPHEN 10-325 MG PO TABS: 1.0000 | ORAL_TABLET | ORAL | 0 refills | Status: DC | PRN

## 2016-06-17 MED ORDER — MIDAZOLAM HCL 2 MG/2ML IJ SOLN
INTRAMUSCULAR | Status: AC
  Filled 2016-06-17: qty 2

## 2016-06-17 MED ORDER — SODIUM CHLORIDE 0.9 % IR SOLN
Status: DC | PRN
  Administered 2016-06-17: 1000 mL

## 2016-06-17 MED ORDER — ONDANSETRON HCL 4 MG/2ML IJ SOLN
INTRAMUSCULAR | Status: DC | PRN
  Administered 2016-06-17: 4 mg via INTRAVENOUS

## 2016-06-17 MED ORDER — BELLADONNA ALKALOIDS-OPIUM 16.2-60 MG RE SUPP
RECTAL | Status: AC
  Filled 2016-06-17: qty 1

## 2016-06-17 MED ORDER — MIDAZOLAM HCL 5 MG/5ML IJ SOLN
INTRAMUSCULAR | Status: DC | PRN
  Administered 2016-06-17: 0.5 mg via INTRAVENOUS

## 2016-06-17 MED ORDER — CIPROFLOXACIN IN D5W 200 MG/100ML IV SOLN
INTRAVENOUS | Status: AC
  Filled 2016-06-17: qty 100

## 2016-06-17 MED ORDER — IOHEXOL 300 MG/ML  SOLN
INTRAMUSCULAR | Status: DC | PRN
  Administered 2016-06-17: 17 mL via INTRAVENOUS

## 2016-06-17 MED ORDER — LIDOCAINE 2% (20 MG/ML) 5 ML SYRINGE
INTRAMUSCULAR | Status: AC
  Filled 2016-06-17: qty 5

## 2016-06-17 MED ORDER — PHENAZOPYRIDINE HCL 200 MG PO TABS
200.0000 mg | ORAL_TABLET | Freq: Three times a day (TID) | ORAL | Status: DC
  Administered 2016-06-17: 200 mg via ORAL
  Filled 2016-06-17: qty 1

## 2016-06-17 MED ORDER — DEXAMETHASONE SODIUM PHOSPHATE 4 MG/ML IJ SOLN
INTRAMUSCULAR | Status: DC | PRN
  Administered 2016-06-17: 10 mg via INTRAVENOUS

## 2016-06-17 MED ORDER — ONDANSETRON HCL 4 MG/2ML IJ SOLN
INTRAMUSCULAR | Status: AC
  Filled 2016-06-17: qty 2

## 2016-06-17 MED ORDER — PROMETHAZINE HCL 25 MG/ML IJ SOLN
6.2500 mg | INTRAMUSCULAR | Status: DC | PRN
  Filled 2016-06-17: qty 1

## 2016-06-17 MED ORDER — LIDOCAINE HCL 2 % EX GEL
CUTANEOUS | Status: AC
  Filled 2016-06-17: qty 5

## 2016-06-17 SURGICAL SUPPLY — 40 items
BAG DRAIN URO-CYSTO SKYTR STRL (DRAIN) ×2 IMPLANT
BAG DRN UROCATH (DRAIN) ×1
BASKET LASER NITINOL 1.9FR (BASKET) IMPLANT
BASKET STNLS GEMINI 4WIRE 3FR (BASKET) IMPLANT
BASKET ZERO TIP NITINOL 2.4FR (BASKET) IMPLANT
BSKT STON RTRVL 120 1.9FR (BASKET)
BSKT STON RTRVL GEM 120X11 3FR (BASKET)
BSKT STON RTRVL ZERO TP 2.4FR (BASKET)
CATH INTERMIT  6FR 70CM (CATHETERS) ×1 IMPLANT
CATH URET 5FR 28IN CONE TIP (BALLOONS)
CATH URET 5FR 28IN OPEN ENDED (CATHETERS) ×1 IMPLANT
CATH URET 5FR 70CM CONE TIP (BALLOONS) IMPLANT
CLOTH BEACON ORANGE TIMEOUT ST (SAFETY) ×2 IMPLANT
ELECT COAG BALL END 3FR (ELECTROSURGICAL) ×2
ELECT REM PT RETURN 9FT ADLT (ELECTROSURGICAL)
ELECTRODE COAG BALL END 3FR (ELECTROSURGICAL) IMPLANT
ELECTRODE REM PT RTRN 9FT ADLT (ELECTROSURGICAL) IMPLANT
FIBER LASER FLEXIVA 365 (UROLOGICAL SUPPLIES) IMPLANT
FIBER LASER FLEXIVA 550 (UROLOGICAL SUPPLIES) IMPLANT
FIBER LASER TRAC TIP (UROLOGICAL SUPPLIES) IMPLANT
GLOVE BIO SURGEON STRL SZ8 (GLOVE) ×2 IMPLANT
GOWN STRL REUS W/ TWL LRG LVL3 (GOWN DISPOSABLE) ×1 IMPLANT
GOWN STRL REUS W/ TWL XL LVL3 (GOWN DISPOSABLE) ×1 IMPLANT
GOWN STRL REUS W/TWL LRG LVL3 (GOWN DISPOSABLE) ×2
GOWN STRL REUS W/TWL XL LVL3 (GOWN DISPOSABLE) ×2
GUIDEWIRE 0.038 PTFE COATED (WIRE) IMPLANT
GUIDEWIRE ANG ZIPWIRE 038X150 (WIRE) IMPLANT
GUIDEWIRE STR DUAL SENSOR (WIRE) ×2 IMPLANT
IV NS IRRIG 3000ML ARTHROMATIC (IV SOLUTION) ×4 IMPLANT
KIT BALLIN UROMAX 15FX10 (LABEL) IMPLANT
KIT BALLN UROMAX 15FX4 (MISCELLANEOUS) IMPLANT
KIT BALLN UROMAX 26 75X4 (MISCELLANEOUS)
KIT ROOM TURNOVER WOR (KITS) ×2 IMPLANT
MANIFOLD NEPTUNE II (INSTRUMENTS) IMPLANT
PACK CYSTO (CUSTOM PROCEDURE TRAY) ×2 IMPLANT
SET HIGH PRES BAL DIL (LABEL)
SHEATH ACCESS URETERAL 24CM (SHEATH) ×1 IMPLANT
SHEATH ACCESS URETERAL 38CM (SHEATH) IMPLANT
TUBE CONNECTING 12X1/4 (SUCTIONS) IMPLANT
WATER STERILE IRR 3000ML UROMA (IV SOLUTION) IMPLANT

## 2016-06-17 NOTE — Transfer of Care (Signed)
Immediate Anesthesia Transfer of Care Note  Patient: MILISSA SWINK  Procedure(s) Performed: Procedure(s): CYSTOSCOPY , URETEROSCOPY  WITH  FULGUTATION (Right)  Patient Location: PACU  Anesthesia Type:General  Level of Consciousness: awake, alert , oriented and patient cooperative  Airway & Oxygen Therapy: Patient Spontanous Breathing and Patient connected to nasal cannula oxygen  Post-op Assessment: Report given to RN and Post -op Vital signs reviewed and stable  Post vital signs: Reviewed and stable  Last Vitals:  Vitals:   06/17/16 0628  BP: (!) 147/56  Pulse: (!) 55  Resp: 20  Temp: 36.4 C    Last Pain:  Vitals:   06/17/16 0628  TempSrc: Oral      Patients Stated Pain Goal: 7 ([id number])  Complications: No apparent anesthesia complications

## 2016-06-17 NOTE — Anesthesia Postprocedure Evaluation (Signed)
Anesthesia Post Note  Patient: Jennifer Wall  Procedure(s) Performed: Procedure(s) (LRB): CYSTOSCOPY , URETEROSCOPY  WITH  FULGUTATION (Right)  Patient location during evaluation: PACU Anesthesia Type: General Level of consciousness: awake and alert Pain management: pain level controlled Vital Signs Assessment: post-procedure vital signs reviewed and stable Respiratory status: spontaneous breathing, nonlabored ventilation, respiratory function stable and patient connected to nasal cannula oxygen Cardiovascular status: blood pressure returned to baseline and stable Postop Assessment: no signs of nausea or vomiting Anesthetic complications: no    Last Vitals:  Vitals:   06/17/16 0900 06/17/16 0937  BP: (!) 144/64 (!) 148/66  Pulse: (!) 48 (!) 49  Resp: 12 16  Temp:  36.5 C    Last Pain:  Vitals:   06/17/16 0900  TempSrc:   PainSc: 0-No pain                 Chinwe Lope J

## 2016-06-17 NOTE — Anesthesia Procedure Notes (Signed)
Procedure Name: LMA Insertion Date/Time: 06/17/2016 7:37 AM Performed by: Wanita Chamberlain Pre-anesthesia Checklist: Patient identified, Timeout performed, Emergency Drugs available, Suction available and Patient being monitored Patient Re-evaluated:Patient Re-evaluated prior to inductionOxygen Delivery Method: Circle system utilized Preoxygenation: Pre-oxygenation with 100% oxygen Intubation Type: IV induction Ventilation: Mask ventilation without difficulty LMA: LMA inserted LMA Size: 4.0 Number of attempts: 1 Placement Confirmation: positive ETCO2 and breath sounds checked- equal and bilateral Tube secured with: Tape Dental Injury: Teeth and Oropharynx as per pre-operative assessment

## 2016-06-17 NOTE — Discharge Instructions (Signed)
°Post Anesthesia Home Care Instructions ° °Activity: °Get plenty of rest for the remainder of the day. A responsible adult should stay with you for 24 hours following the procedure.  °For the next 24 hours, DO NOT: °-Drive a car °-Operate machinery °-Drink alcoholic beverages °-Take any medication unless instructed by your physician °-Make any legal decisions or sign important papers. ° °Meals: °Start with liquid foods such as gelatin or soup. Progress to regular foods as tolerated. Avoid greasy, spicy, heavy foods. If nausea and/or vomiting occur, drink only clear liquids until the nausea and/or vomiting subsides. Call your physician if vomiting continues. ° °Special Instructions/Symptoms: °Your throat may feel dry or sore from the anesthesia or the breathing tube placed in your throat during surgery. If this causes discomfort, gargle with warm salt water. The discomfort should disappear within 24 hours. ° °If you had a scopolamine patch placed behind your ear for the management of post- operative nausea and/or vomiting: ° °1. The medication in the patch is effective for 72 hours, after which it should be removed.  Wrap patch in a tissue and discard in the trash. Wash hands thoroughly with soap and water. °2. You may remove the patch earlier than 72 hours if you experience unpleasant side effects which may include dry mouth, dizziness or visual disturbances. °3. Avoid touching the patch. Wash your hands with soap and water after contact with the patch. °  ° ° ° °Post stent placement instructions ° ° °Definitions: ° °Ureter: The duct that transports urine from the kidney to the bladder. °Stent: A plastic hollow tube that is placed into the ureter, from the kidney to the bladder to prevent the ureter from swelling shut. ° °General instructions: ° °Despite the fact that no skin incisions were used, the area around the ureter and bladder is raw and irritated. The stent is a foreign body which can further irritate the  bladder wall. This irritation is manifested by increased frequency of urination, both day and night, and by an increase in the urge to urinate. In some, the urge to urinate is present almost always. Sometimes the urge is strong enough that you may not be able to stop your self from urinating. This can often be controlled with medication but does not occur in everyone. A stent can safely be left in place for 3 months or greater. ° °You may see some blood in your urine while the stent is in place and a few days afterward. Do not be alarmed, even if the urine is clear for a while. Get off your feet and drink lots of fluids until clearing occurs. If you start to pass clots or don't improve, call us. ° °Diet: ° °You may return to your normal diet immediately. Because of the raw surface of your bladder, alcohol, spicy foods, foods high in acid and drinks with caffeine may cause irritation or frequency and should be used in moderation. To keep your urine flowing freely and avoid constipation, drink plenty of fluids during the day (8-10 glasses). Tip: Avoid cranberry juice because it is very acidic. ° °Activity: ° °Your physical activity doesn't need to be restricted. However, if you are very active, you may see some blood in the urine. We suggest that you reduce your activity under the circumstances until the bleeding has stopped. ° °Bowels: ° °It is important to keep your bowels regular during the postoperative period. Straining with bowel movements can cause bleeding. A bowel movement every other day is reasonable. Use   a mild laxative if needed, such as milk of magnesia 2-3 tablespoons, or 2 Dulcolax tablets. Call if you continue to have problems. If you had been taking narcotics for pain, before, during or after your surgery, you may be constipated. Take a laxative if necessary. ° °Medication: ° °You should resume your pre-surgery medications unless told not to. In addition you may be given an antibiotic to prevent or  treat infection. Antibiotics are not always necessary. All medication should be taken as prescribed until the bottles are finished unless you are having an unusual reaction to one of the drugs. ° °Problems you should report to us: ° °a. Fever greater than 101°F. °b. Heavy bleeding, or clots (see notes above about blood in urine). °c. Inability to urinate. °d. Drug reactions (hives, rash, nausea, vomiting, diarrhea). °e. Severe burning or pain with urination that is not improving. ° ° °

## 2016-06-17 NOTE — Anesthesia Preprocedure Evaluation (Addendum)
Anesthesia Evaluation  Patient identified by MRN, date of birth, ID band Patient awake    Reviewed: Allergy & Precautions, NPO status , Patient's Chart, lab work & pertinent test results  Airway Mallampati: II  TM Distance: >3 FB Neck ROM: Full    Dental no notable dental hx.    Pulmonary neg pulmonary ROS,    Pulmonary exam normal breath sounds clear to auscultation       Cardiovascular Exercise Tolerance: Good hypertension, Pt. on medications and Pt. on home beta blockers Normal cardiovascular exam+ dysrhythmias Atrial Fibrillation and Supra Ventricular Tachycardia + Valvular Problems/Murmurs  Rhythm:Regular Rate:Normal  22-Sep-2015 Sinus bradycardia Possible Anterior infarct , age undetermined Abnormal ECG HR is slower since previous ECG Vent. rate 56 BPM  Sinus Bradycardia this am   Neuro/Psych  Neuromuscular disease negative psych ROS   GI/Hepatic Neg liver ROS, hiatal hernia, GERD  Medicated,  Endo/Other  diabetes, Well Controlled, Type 2  Renal/GU Renal diseaseBladder tumor  negative genitourinary   Musculoskeletal negative musculoskeletal ROS (+)   Abdominal   Peds negative pediatric ROS (+)  Hematology negative hematology ROS (+)   Anesthesia Other Findings   Reproductive/Obstetrics negative OB ROS                          Anesthesia Physical Anesthesia Plan  ASA: III  Anesthesia Plan: General   Post-op Pain Management:    Induction: Intravenous  Airway Management Planned: LMA  Additional Equipment:   Intra-op Plan:   Post-operative Plan: Extubation in OR  Informed Consent: I have reviewed the patients History and Physical, chart, labs and discussed the procedure including the risks, benefits and alternatives for the proposed anesthesia with the patient or authorized representative who has indicated his/her understanding and acceptance.   Dental advisory  given  Plan Discussed with: CRNA  Anesthesia Plan Comments:         Anesthesia Quick Evaluation

## 2016-06-17 NOTE — Op Note (Signed)
PATIENT:  Jennifer Wall  PRE-OPERATIVE DIAGNOSIS: History of history of right upper pole calyceal transitional cell carcinoma  POST-OPERATIVE DIAGNOSIS: Right upper pole calyceal transitional cell carcinoma  PROCEDURE: 1. Cystoscopy with right retrograde pyelogram including interpretation 2. Right ureteroscopy with laser fulguration of upper pole transitional cell carcinoma (recurrent) 3. Fluoroscopy use less than 1 hour  SURGEON:  Claybon Jabs  INDICATION: Jennifer Wall is a 70 year old female who had gross hematuria and was found to have a small papillary lesion in an upper pole calyx of a partially duplicated system. She underwent fulguration of the lesion as it appeared to be small and superficial. This was initially performed in 2/17 and then reevaluation ureteroscopically revealed no evidence of recurrence. Follow-up imaging studies revealed no recurrence however she is brought to the operating room for a direct visualization of the area today. She has not been symptomatic.  ANESTHESIA:  General  EBL:  Minimal  DRAINS: None  LOCAL MEDICATIONS USED:  None  SPECIMEN:  None  Description of procedure: After informed consent the patient was taken to the operating room and placed on the table in a supine position. General anesthesia was then administered. Once fully anesthetized the patient was moved to the dorsal lithotomy position and the genitalia were sterilely prepped and draped in standard fashion. An official timeout was then performed.  Initially the 23 French rigid cystoscope was introduced into the bladder and a full and systematic inspection of the bladder was undertaken. There were no tumors, stones or inflammatory lesions identified within the bladder. Both ureteral orifices were noted to be of normal configuration and position. She is noted to have a grade 3 cystocele.  A 6 French open-ended ureteral catheter was then passed through the cystoscope and into the right  ureteral orifice in preparation for right retrograde pyelogram. This was performed by injecting full-strength Omnipaque contrast through the open-ended catheter and up the ureter which was noted to be free of any filling defects or abnormalities. There was duplication of the ureter at the level of the superior border of the sacrum and filling of the upper and lower pole moieties revealed no definite filling defects.  A guidewire was then passed through the open-ended catheter and into the right ureter. I was not able to negotiate the guidewire to the upper pole moiety so I left it in the mid ureter and used the inner portion of a ureteral access sheath to gently dilate the intramural ureter. Over the guidewire I passed the 6 French rigid, digital ureteroscope and then remove the guidewire. Under direct vision I advanced the scope up the ureter and noted the duplication. I was able to negotiate the scope into the upper pole ureter and visualized the most superior calyx which was compounded noted to be normal. Inferior to this was a calyceal opening that contained a papillary lesion consistent with recurrent transitional cell carcinoma that appeared superficial.  I used the Bugbee electrode to fulgurate this lesion completely and the surrounding mucosa. I then advanced the scope into the calyx and noted no further lesions within this calyx. With all of the obvious lesion fulgurated and the surrounding mucosa having been fulgurated as well I removed the ureteroscope by advancing it down the ureter under direct vision and noted no lesions within the ureter. The ureteroscope was removed and I then drained the bladder with the cystoscope sheath and the patient was awakened and taken to the recovery room in stable and satisfactory condition. She tolerated procedure  well no intraoperative complications.  PLAN OF CARE: Discharge to home after PACU  PATIENT DISPOSITION:  PACU - hemodynamically stable.

## 2016-06-18 ENCOUNTER — Ambulatory Visit
Admission: RE | Admit: 2016-06-18 | Discharge: 2016-06-18 | Disposition: A | Discharge status: Home / Self Care | Payer: Medicare Other | Source: Ambulatory Visit | Attending: Internal Medicine | Admitting: Internal Medicine

## 2016-06-18 ENCOUNTER — Encounter (HOSPITAL_BASED_OUTPATIENT_CLINIC_OR_DEPARTMENT_OTHER): Payer: Self-pay | Admitting: Urology

## 2016-06-18 DIAGNOSIS — R0989 Other specified symptoms and signs involving the circulatory and respiratory systems: Secondary | ICD-10-CM

## 2016-09-06 ENCOUNTER — Encounter: Payer: Self-pay | Admitting: Cardiology

## 2016-09-06 DIAGNOSIS — E119 Type 2 diabetes mellitus without complications: Secondary | ICD-10-CM

## 2016-09-06 DIAGNOSIS — Z85528 Personal history of other malignant neoplasm of kidney: Secondary | ICD-10-CM | POA: Insufficient documentation

## 2016-09-06 HISTORY — DX: Personal history of other malignant neoplasm of kidney: Z85.528

## 2016-09-06 HISTORY — DX: Type 2 diabetes mellitus without complications: E11.9

## 2016-10-30 ENCOUNTER — Emergency Department (HOSPITAL_COMMUNITY)
Admission: EM | Admit: 2016-10-30 | Discharge: 2016-10-30 | Disposition: A | Discharge status: Home / Self Care | Payer: Medicare Other | Attending: Emergency Medicine | Admitting: Emergency Medicine

## 2016-10-30 ENCOUNTER — Emergency Department (HOSPITAL_COMMUNITY): Payer: Medicare Other

## 2016-10-30 ENCOUNTER — Ambulatory Visit (INDEPENDENT_AMBULATORY_CARE_PROVIDER_SITE_OTHER): Payer: Medicare Other | Admitting: Podiatry

## 2016-10-30 ENCOUNTER — Encounter: Payer: Self-pay | Admitting: Podiatry

## 2016-10-30 DIAGNOSIS — Z85528 Personal history of other malignant neoplasm of kidney: Secondary | ICD-10-CM | POA: Insufficient documentation

## 2016-10-30 DIAGNOSIS — Z85828 Personal history of other malignant neoplasm of skin: Secondary | ICD-10-CM | POA: Diagnosis not present

## 2016-10-30 DIAGNOSIS — E109 Type 1 diabetes mellitus without complications: Secondary | ICD-10-CM | POA: Diagnosis not present

## 2016-10-30 DIAGNOSIS — R109 Unspecified abdominal pain: Secondary | ICD-10-CM | POA: Insufficient documentation

## 2016-10-30 DIAGNOSIS — E139 Other specified diabetes mellitus without complications: Secondary | ICD-10-CM

## 2016-10-30 DIAGNOSIS — Z5321 Procedure and treatment not carried out due to patient leaving prior to being seen by health care provider: Secondary | ICD-10-CM | POA: Insufficient documentation

## 2016-10-30 DIAGNOSIS — Q828 Other specified congenital malformations of skin: Secondary | ICD-10-CM | POA: Diagnosis not present

## 2016-10-30 DIAGNOSIS — E119 Type 2 diabetes mellitus without complications: Secondary | ICD-10-CM | POA: Diagnosis not present

## 2016-10-30 DIAGNOSIS — Z7984 Long term (current) use of oral hypoglycemic drugs: Secondary | ICD-10-CM | POA: Diagnosis not present

## 2016-10-30 DIAGNOSIS — I10 Essential (primary) hypertension: Secondary | ICD-10-CM | POA: Diagnosis not present

## 2016-10-30 LAB — CBC
HCT: 36.8 % (ref 36.0–46.0)
Hemoglobin: 12.7 g/dL (ref 12.0–15.0)
MCH: 30.6 pg (ref 26.0–34.0)
MCHC: 34.5 g/dL (ref 30.0–36.0)
MCV: 88.7 fL (ref 78.0–100.0)
PLATELETS: 152 10*3/uL (ref 150–400)
RBC: 4.15 MIL/uL (ref 3.87–5.11)
RDW: 11.7 % (ref 11.5–15.5)
WBC: 4.3 10*3/uL (ref 4.0–10.5)

## 2016-10-30 LAB — I-STAT TROPONIN, ED: TROPONIN I, POC: 0 ng/mL (ref 0.00–0.08)

## 2016-10-30 LAB — BASIC METABOLIC PANEL
Anion gap: 5 (ref 5–15)
BUN: 21 mg/dL — AB (ref 6–20)
CALCIUM: 9.6 mg/dL (ref 8.9–10.3)
CHLORIDE: 106 mmol/L (ref 101–111)
CO2: 26 mmol/L (ref 22–32)
CREATININE: 0.94 mg/dL (ref 0.44–1.00)
GFR calc Af Amer: >60 mL/min (ref >60)
GFR calc non Af Amer: 60 mL/min — ABNORMAL LOW (ref >60)
Glucose, Bld: 171 mg/dL — ABNORMAL HIGH (ref 65–99)
Potassium: 4.3 mmol/L (ref 3.5–5.1)
SODIUM: 137 mmol/L (ref 135–145)

## 2016-10-30 NOTE — ED Triage Notes (Signed)
Pt states that she has had abdominal pain that progressed into her chest and back tonight since 11:20pm. Alert and oriented. Denies N/V/D. Hx of pancreatitis but felt different.

## 2016-10-30 NOTE — Progress Notes (Signed)
   Subjective:    Patient ID: Jennifer Wall, female    DOB: 06-Feb-1946, 71 y.o.   MRN: [id number]  HPI Chief Complaint  Patient presents with  . Painful lesion    Bilateral; plantar forefoot; x8 months      Review of Systems  Neurological: Positive for headaches.  All other systems reviewed and are negative.      Objective:   Physical Exam        Assessment & Plan:

## 2016-11-01 NOTE — Progress Notes (Signed)
Subjective:     Patient ID: Jennifer Wall, female   DOB: July 23, 1946, 71 y.o.   MRN: [id number]  HPI patient presents stating that she has lesions on the bottom of her left foot which are painful and she's a diabetic and cannot cut them   Review of Systems  All other systems reviewed and are negative.      Objective:   Physical Exam  Constitutional: She is oriented to person, place, and time.  Cardiovascular: Intact distal pulses.   Musculoskeletal: Normal range of motion.  Neurological: She is oriented to person, place, and time.  Skin: Skin is warm and dry.  Nursing note and vitals reviewed.  neurovascular status intact muscle strength was adequate with patient found to have keratotic lesions plantar left that are painful when pressed and making walking difficult. Patient's found to have good digital perfusion well oriented 3 with long-term history of diabetes with diminished sharp dull and vibratory     Assessment:     Long-term diabetic with moderate neuropathic changes and keratotic lesion sub-left foot that are painful    Plan:     H&P diabetic education rendered and debridement accomplished with no iatrogenic bleeding. Explain this will need to be done periodically and she also may require diabetic shoes at one point in future

## 2017-03-28 ENCOUNTER — Other Ambulatory Visit: Payer: Self-pay | Admitting: Urology

## 2017-04-04 ENCOUNTER — Encounter (HOSPITAL_BASED_OUTPATIENT_CLINIC_OR_DEPARTMENT_OTHER): Payer: Self-pay | Admitting: *Deleted

## 2017-04-04 NOTE — Progress Notes (Signed)
NPO AFTER MN.  ARRIVE AT 0915.  NEEDS ISTAT 8.  CURRENT EKG IN CHART AND EPIC.  WILL TAKE TOPROL AND PROTONIX AM DOS W/ SIPS OF WATER.

## 2017-04-09 ENCOUNTER — Other Ambulatory Visit: Payer: Self-pay | Admitting: Internal Medicine

## 2017-04-09 DIAGNOSIS — E041 Nontoxic single thyroid nodule: Secondary | ICD-10-CM

## 2017-04-13 NOTE — H&P (Signed)
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  Her cancer was diagnosed 10/02/2015. She has had laser ablation and fulgurated to treat her transitional cell carcinoma. She has not received radiation therapy. She did not receive chemotherapy for her cancer.   She did not see blood in her urine. She has not recently had unwanted weight loss.   Her last cysto was 06/17/2016. Her last radiologic test to evaluate the kidneys was 12/04/2016.     CC: I have a cystocele.  HPI: Her symptoms have gotten worse over the last year. She does not have pelvic pain. She is having problems with urinary control or incontinence. She can get to the bathroom in time when she gets the urge to urinate. She does not have pain with urination.   02/10/17: Today, she presents with complaints of worsening bulging sensation in her vagina over the past 2 weeks. She also complains of her some incontinence without sensation over the past week. She states that this is becoming bothersome to her at this point. She complains of hesitancy during voiding, but states that she feels that she empties her bladder well. She has noticed some intermittent bright red blood with wiping over the past several days, but states that this seems to correlate more with bowel movements. She denies any gross hematuria, dysuria, fevers, suprapubic pain, or flank pain.   Interval history 03/25/17: Although she has had a known cystocele for some time she said it feels as if it is changing position. In addition she notes that when she wipes after urinating she will see a small amount of blood on the tissue but is never experienced any hematuria. The other week she had an episode where she actually saw drops of blood on the bathroom floor and noted bleeding from the vagina. She said she also feels like there may be something coming from the area of the rectum.  She noted that now when she urinates and stands  she will leak a small amount of urine. She said it doesn't occur with urgency or has she noted with coughing or sneezing but since she has not been doing much coughing or sneezing recently.     ALLERGIES: Erythromycin TABS - Other Reaction, flushing metformin - Other Reaction, pancreatitis Tamsulosin - Diarrhea, Vomiting, tired and yawny, issues walking from chair to sofa Tricor TABS - Other Reaction, sore legs    MEDICATIONS: Hydrochlorothiazide  Acetaminophen 1 PO Daily  Calcium + D TABS Oral  Celexa 10 mg tablet Oral  Creon 36,000 unit-114,000 unit-180,000 unit capsule,delayed release  Glimepiride  Lisinopril 20 mg tablet Oral  Lovaza 1 gram capsule Oral  Metoprolol Tartrate 50 mg tablet Oral  Nasacort  Pantoprazole Sodium 40 mg tablet, delayed release Oral  Simvastatin 40 mg tablet Oral  Spironolactone 25 mg tablet  Vitamin C 500 mg tablet Oral  Vitamin D 50,000 unit capsule Oral     GU PSH: Cysto Uretero Biopsy Fulgura - 06/17/2016, 11/15/2015, 10/03/2015 Cysto Uretero W/excise Tumor - 10/03/2015 Cystoscopy Insert Stent - 10/03/2015 Locm 300-399Mg /Ml Iodine,1Ml - 02/29/2016      PSH Notes: Radical Pancreaticoduodenectomy   NON-GU PSH: Back surgery    GU PMH: Ureteral calculus (Improving), She likely had a stone in her ureter but I don't think she had a stone at the time of her CT scan and it appeared it had passed. I'm going to plan to do her follow-up right ureteroscopy as previously scheduled. -  12/05/2016, (Acute), Right, Given strainer. If stone captured bring to office. Tamsulosin 0.4 mg 1 po daily. Tramadol 50 mg 1 po Q6 hrs prn. F/U in 2-3 weeks for OV/KUB w/Dr. Karsten Ro if stone still present may need to consider elective cystourethroscopy, (R) RPG, stone extraction. , - 11/12/2016 Abdominal Pain Unspec (Worsening, Chronic), With Ca Oxalte noted on UA today. Will proceed with CT urogram. - 11/12/2016 Dysuria (Worsening, Chronic), Culture urine. No ABX unless culture proven  UTI. - 11/12/2016 Renal cell carcinoma, right (Stable), I'm going to have her return in 10 months for re-evaluation and scheduling of repeat ureteroscopic evaluation of her right kidney. - 06/21/2016, (Stable), We discussed the results of her CT scan images revealed no new filling defects to suggest a papillary lesions within the upper pole of her right kidney. While the did appear to be some areas of increased enhancement this may be due to the fulguration I did in that area. What I have recommended that at this point is that we obtain a urine cytology and follow this closely. We discussed the options of continued observation with CT scan versus ureteroscopy. Since there is no definite lesion and goal is to prevent progression while hopefully avoiding a nephroureterectomy we have decided to proceed as we had discussed previously with repeat right ureteroscopy and biopsy versus fulguration., - 03/19/2016, Transitional cell carcinoma of kidney, right, - 11/20/2015 Abnormal radiologic findings on diagnositic imaging of unspecified kidney, Kidney filling defect - 8413 Duplication of ureter, Ureteral duplication, right - 2440 Renal cyst, Bilateral renal cysts - 2017 Cystocele, Unspec, Cystocele - 2017      PMH Notes: Right upper pole calyceal TCCa: She experienced intermittent gross hematuria with no discomfort and no risk factors identified.  Workup in 6/16: CT scan, cystoscopy and cytology - negative.  CT scan 1/02 at [facility] - duplicated system on the right hand side with a 2.1 x 2.4 cm stone in her bladder described as being in an inferior cystocele.  I reviewed the CT scan images from [facility] which revealed a density in what appeared to be a cystocele. It appeared to be quite large and was seen on axial images only as this area was cut off on sagittal and coronal imaging.  Cystoscopy 8/16 - large cystocele, no bladder stones and no bladder lesions.  She was found to have blood coming from her right UO and  underwent ureteroscopy, biopsy and fulguration of a high-grade papillary transitional cell carcinoma on 10/02/15.  Pathology: Transitional cell carcinoma with no evidence of invasion (Ta,G3)  Relook 11/13/15 - no definite papillary tumor identified. The area made biopsy difficult and the small volume of her renal pelvis making barbotage difficult and both of these tests were negative but there was minimal specimen.   Bilateral renal cyst: These were incidentally noted on CT scan in 6/16.   Cystocele: She has moderate pelvic prolapse that actually could be seen on her CT scan. She is asymptomatic from this.     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 Encounter for general adult medical examination without abnormal findings, Encounter for preventive health examination - 2016 Personal history of colonic polyps, History of colonic polyps - 2016 Personal history of malignant melanoma of skin, History of malignant melanoma - 2016 Personal history of other diseases of the circulatory system, History of hypertension - 2016 Personal history of other diseases of the digestive system, History of esophageal reflux - 2016 Personal history of other diseases  of the musculoskeletal system and connective tissue, History of osteopenia - 2016 Personal history of other endocrine, nutritional and metabolic disease, History of hypercholesterolemia - 2016, History of hyperlipidemia, - 2016 Personal history of other mental and behavioral disorders, History of depression - 2016 Skin Cancer, History, History of basal cell carcinoma - 2016 Vitamin D deficiency, unspecified, Vitamin D deficiency - 2016 Other chronic pancreatitis    FAMILY HISTORY: Death - Mother, Father Deceased - Runs In Family Hypertension - Runs In Family liver cancer - Runs In Family renal failure - Runs In Family   SOCIAL HISTORY: Marital Status: Married Preferred  Language: English; Ethnicity: Not Hispanic Or Latino; Race: White Current Smoking Status: Patient has never smoked.  Social Drinker.  Drinks 1 caffeinated drink per day. Patient's occupation is/was Retired.    REVIEW OF SYSTEMS:    GU Review Female:   Patient reports hard to postpone urination, leakage of urine, and trouble starting your stream. Patient denies frequent urination, burning /pain with urination, get up at night to urinate, stream starts and stops, have to strain to urinate, and being pregnant.  Gastrointestinal (Upper):   Patient denies nausea, vomiting, and indigestion/ heartburn.  Gastrointestinal (Lower):   Patient denies diarrhea and constipation.  Constitutional:   Patient denies fever, night sweats, weight loss, and fatigue.  Skin:   Patient denies skin rash/ lesion and itching.  Eyes:   Patient denies blurred vision and double vision.  Ears/ Nose/ Throat:   Patient denies sore throat and sinus problems.  Hematologic/Lymphatic:   Patient denies swollen glands and easy bruising.  Cardiovascular:   Patient denies leg swelling and chest pains.  Respiratory:   Patient denies cough and shortness of breath.  Endocrine:   Patient denies excessive thirst.  Musculoskeletal:   Patient denies back pain and joint pain.  Neurological:   Patient denies headaches and dizziness.  Psychologic:   Patient denies depression and anxiety.   VITAL SIGNS:    Weight 123 lb / 55.79 kg  Height 62 in / 157.48 cm  BP 130/75 mmHg  Pulse 45 /min  Temperature 97.9 F / 36.6 C  BMI 22.5 kg/m   GU PHYSICAL EXAMINATION:    External Genitalia: No hirsutism, no rash, no scarring, no cyst, no erythematous lesion, no papular lesion, no blanched lesion, no warty lesion. No edema.  Urethral Meatus: Normal size. Normal position. No discharge.  Urethra: No tenderness, no mass, no scarring. No hypermobility. No leakage.  Bladder: Normal to palpation, no tenderness, no mass, normal size.  Vagina: Mild  vaginal atrophy. Third-degree cystocele. No stenosis. No rectocele. No enterocele.   Cervix: No inflammation, no discharge, no lesion, no tenderness, no wart. There was no discharge from the cervix however the office appeared slightly irregular and although this may be benign changes with the blood that she has seen I feel it needs further evaluation.   MULTI-SYSTEM PHYSICAL EXAMINATION:    Constitutional: Well nourished and well developed . No acute distress.  ENT:. The ears and nose are normal in appearance.  Neck: The appearance of the neck is normal and no neck mass is present.  Pulmonary: No respiratory distress and normal respiratory rhythm and effort.  Cardiovascular: Heart rate and rhythm are normal . No peripheral edema. Abdomen: The abdomen is soft and nontender. No masses are palpated. No CVA tenderness. No hernias are palpable. No hepatosplenomegaly noted.  Lymphatics: The femoral and inguinal nodes are not enlarged or tender.  Skin: Normal skin turgor, no visible rash  and no visible skin lesions.  Neuro/Psych:. Mood and affect are appropriate.   PAST DATA REVIEWED:  Source Of History:  Patient   PROCEDURES:          Urinalysis w/Scope Dipstick Dipstick Cont'd Micro  Color: Yellow Bilirubin: Neg WBC/hpf: 0 - 5/hpf  Appearance: Clear Ketones: Neg RBC/hpf: 0 - 2/hpf  Specific Gravity: 1.020 Blood: 1+ Bacteria: Rare (0-9/hpf)  pH: 6.0 Protein: Neg Cystals: NS (Not Seen)  Glucose: Neg Urobilinogen: 0.2 Casts: NS (Not Seen)    Nitrites: Neg Trichomonas: Not Present    Leukocyte Esterase: 1+ Mucous: Not Present      Epithelial Cells: 0 - 5/hpf      Yeast: NS (Not Seen)      Sperm: Not Present    Notes: MICROSCOPIC PERFORMED ON UNCONCENTRATED URINE    ASSESSMENT:      ICD-10 Details  1 GU:   Kidney Cancer Unspec, except renal pelvis - C64.9 Stable - She has a history of low-grade transitional cell carcinoma of the upper pole calyx that has been managed with fulguration and  observation. She had a small recurrence after the initial treatment and had gone 10 months without recurrence so it has now been 10 months and we last discussed proceeding with repeat ureteroscopy and fulguration of any lesions identified. This will be scheduled in the near future.  2   Cystocele, midline - N81.11 Worsening - She has a fairly large cystocele and although she seems relatively asymptomatic from this she said she would like to have it treated surgically.  3   Abnormal uterine and vaginal bleeding, unspecified - N93.9 It seems as if she has had some vaginal bleeding recently and this is concerning for a neoplastic process although I found no worrisome lesions of the vaginal mucosa. She does have descent of her cervix and I think this is the firm area that she is palpating but having seen blood dripping is concerning and I feel that oncologic evaluation is indicated. She told me that she has an appointment at [facility] and wants to know if a referral could be made from there and I told her I thought that was reasonable and I would supply my notes to her physician there.

## 2017-04-14 ENCOUNTER — Encounter (HOSPITAL_BASED_OUTPATIENT_CLINIC_OR_DEPARTMENT_OTHER): Payer: Self-pay | Admitting: *Deleted

## 2017-04-14 ENCOUNTER — Ambulatory Visit (HOSPITAL_BASED_OUTPATIENT_CLINIC_OR_DEPARTMENT_OTHER): Payer: Medicare Other | Admitting: Anesthesiology

## 2017-04-14 ENCOUNTER — Ambulatory Visit (HOSPITAL_BASED_OUTPATIENT_CLINIC_OR_DEPARTMENT_OTHER)
Admission: RE | Admit: 2017-04-14 | Discharge: 2017-04-14 | Disposition: A | Discharge status: Home / Self Care | Payer: Medicare Other | Source: Ambulatory Visit | Attending: Urology | Admitting: Urology

## 2017-04-14 ENCOUNTER — Encounter (HOSPITAL_BASED_OUTPATIENT_CLINIC_OR_DEPARTMENT_OTHER)
Admission: RE | Disposition: A | Discharge status: Home / Self Care | Payer: Self-pay | Source: Ambulatory Visit | Attending: Urology

## 2017-04-14 DIAGNOSIS — N2889 Other specified disorders of kidney and ureter: Secondary | ICD-10-CM | POA: Diagnosis not present

## 2017-04-14 DIAGNOSIS — I10 Essential (primary) hypertension: Secondary | ICD-10-CM | POA: Insufficient documentation

## 2017-04-14 DIAGNOSIS — I471 Supraventricular tachycardia: Secondary | ICD-10-CM | POA: Diagnosis not present

## 2017-04-14 DIAGNOSIS — E785 Hyperlipidemia, unspecified: Secondary | ICD-10-CM | POA: Insufficient documentation

## 2017-04-14 DIAGNOSIS — N8111 Cystocele, midline: Secondary | ICD-10-CM | POA: Insufficient documentation

## 2017-04-14 DIAGNOSIS — E559 Vitamin D deficiency, unspecified: Secondary | ICD-10-CM | POA: Insufficient documentation

## 2017-04-14 DIAGNOSIS — N938 Other specified abnormal uterine and vaginal bleeding: Secondary | ICD-10-CM | POA: Insufficient documentation

## 2017-04-14 DIAGNOSIS — Z7984 Long term (current) use of oral hypoglycemic drugs: Secondary | ICD-10-CM | POA: Insufficient documentation

## 2017-04-14 DIAGNOSIS — C651 Malignant neoplasm of right renal pelvis: Secondary | ICD-10-CM | POA: Diagnosis not present

## 2017-04-14 DIAGNOSIS — K219 Gastro-esophageal reflux disease without esophagitis: Secondary | ICD-10-CM | POA: Insufficient documentation

## 2017-04-14 DIAGNOSIS — E119 Type 2 diabetes mellitus without complications: Secondary | ICD-10-CM | POA: Diagnosis not present

## 2017-04-14 DIAGNOSIS — C641 Malignant neoplasm of right kidney, except renal pelvis: Secondary | ICD-10-CM | POA: Diagnosis present

## 2017-04-14 DIAGNOSIS — Z85528 Personal history of other malignant neoplasm of kidney: Secondary | ICD-10-CM

## 2017-04-14 DIAGNOSIS — Z79899 Other long term (current) drug therapy: Secondary | ICD-10-CM | POA: Diagnosis not present

## 2017-04-14 DIAGNOSIS — F329 Major depressive disorder, single episode, unspecified: Secondary | ICD-10-CM | POA: Insufficient documentation

## 2017-04-14 HISTORY — PX: CYSTOSCOPY/RETROGRADE/URETEROSCOPY: SHX5316

## 2017-04-14 HISTORY — DX: Ventral hernia without obstruction or gangrene: K43.9

## 2017-04-14 HISTORY — DX: Personal history of other malignant neoplasm of kidney: Z85.528

## 2017-04-14 HISTORY — DX: Personal history of malignant neoplasm of other organs and systems: Z85.89

## 2017-04-14 HISTORY — DX: Cyst of kidney, acquired: N28.1

## 2017-04-14 HISTORY — DX: Type 2 diabetes mellitus without complications: E11.9

## 2017-04-14 HISTORY — DX: Duplication of ureter: Q62.5

## 2017-04-14 LAB — GLUCOSE, CAPILLARY: Glucose-Capillary: 163 mg/dL — ABNORMAL HIGH (ref 65–99)

## 2017-04-14 LAB — POCT I-STAT, CHEM 8
BUN: 24 mg/dL — ABNORMAL HIGH (ref 6–20)
Calcium, Ion: 1.25 mmol/L (ref 1.15–1.40)
Chloride: 104 mmol/L (ref 101–111)
Creatinine, Ser: 0.7 mg/dL (ref 0.44–1.00)
Glucose, Bld: 193 mg/dL — ABNORMAL HIGH (ref 65–99)
HCT: 42 % (ref 36.0–46.0)
Hemoglobin: 14.3 g/dL (ref 12.0–15.0)
Potassium: 4.1 mmol/L (ref 3.5–5.1)
Sodium: 140 mmol/L (ref 135–145)
TCO2: 22 mmol/L (ref 22–32)

## 2017-04-14 SURGERY — CYSTOSCOPY/RETROGRADE/URETEROSCOPY
Anesthesia: General | Site: Renal | Laterality: Right

## 2017-04-14 MED ORDER — IOHEXOL 300 MG/ML  SOLN
INTRAMUSCULAR | Status: DC | PRN
  Administered 2017-04-14: 10 mL

## 2017-04-14 MED ORDER — FENTANYL CITRATE (PF) 100 MCG/2ML IJ SOLN
INTRAMUSCULAR | Status: AC
  Filled 2017-04-14: qty 2

## 2017-04-14 MED ORDER — EPHEDRINE 5 MG/ML INJ
INTRAVENOUS | Status: AC
  Filled 2017-04-14: qty 10

## 2017-04-14 MED ORDER — CIPROFLOXACIN IN D5W 400 MG/200ML IV SOLN
INTRAVENOUS | Status: AC
Start: 2017-04-14 — End: 2017-04-14
  Filled 2017-04-14: qty 200

## 2017-04-14 MED ORDER — EPHEDRINE SULFATE-NACL 50-0.9 MG/10ML-% IV SOSY
PREFILLED_SYRINGE | INTRAVENOUS | Status: DC | PRN
  Administered 2017-04-14: 10 mg via INTRAVENOUS

## 2017-04-14 MED ORDER — PROPOFOL 10 MG/ML IV BOLUS
INTRAVENOUS | Status: DC | PRN
  Administered 2017-04-14: 150 mg via INTRAVENOUS

## 2017-04-14 MED ORDER — FENTANYL CITRATE (PF) 100 MCG/2ML IJ SOLN
INTRAMUSCULAR | Status: DC | PRN
  Administered 2017-04-14: 50 ug via INTRAVENOUS
  Administered 2017-04-14: 25 ug via INTRAVENOUS

## 2017-04-14 MED ORDER — CIPROFLOXACIN IN D5W 400 MG/200ML IV SOLN
400.0000 mg | INTRAVENOUS | Status: AC
  Administered 2017-04-14: 400 mg via INTRAVENOUS
  Filled 2017-04-14: qty 200

## 2017-04-14 MED ORDER — ONDANSETRON HCL 4 MG/2ML IJ SOLN
INTRAMUSCULAR | Status: AC
  Filled 2017-04-14: qty 2

## 2017-04-14 MED ORDER — ONDANSETRON HCL 4 MG/2ML IJ SOLN
INTRAMUSCULAR | Status: DC | PRN
  Administered 2017-04-14: 4 mg via INTRAVENOUS

## 2017-04-14 MED ORDER — DEXAMETHASONE SODIUM PHOSPHATE 4 MG/ML IJ SOLN
INTRAMUSCULAR | Status: DC | PRN
  Administered 2017-04-14: 10 mg via INTRAVENOUS

## 2017-04-14 MED ORDER — FENTANYL CITRATE (PF) 100 MCG/2ML IJ SOLN
25.0000 ug | INTRAMUSCULAR | Status: DC | PRN
  Filled 2017-04-14: qty 1

## 2017-04-14 MED ORDER — SODIUM CHLORIDE 0.9 % IR SOLN
Status: DC | PRN
  Administered 2017-04-14: 3000 mL

## 2017-04-14 MED ORDER — MIDAZOLAM HCL 2 MG/2ML IJ SOLN
INTRAMUSCULAR | Status: AC
  Filled 2017-04-14: qty 2

## 2017-04-14 MED ORDER — PHENAZOPYRIDINE HCL 200 MG PO TABS: 200.0000 mg | ORAL_TABLET | Freq: Three times a day (TID) | ORAL | 0 refills | Status: DC | PRN

## 2017-04-14 MED ORDER — LIDOCAINE 2% (20 MG/ML) 5 ML SYRINGE
INTRAMUSCULAR | Status: DC | PRN
  Administered 2017-04-14: 60 mg via INTRAVENOUS

## 2017-04-14 MED ORDER — LACTATED RINGERS IV SOLN
INTRAVENOUS | Status: DC
  Administered 2017-04-14: 10:00:00 via INTRAVENOUS
  Filled 2017-04-14: qty 1000

## 2017-04-14 MED ORDER — PROPOFOL 10 MG/ML IV BOLUS
INTRAVENOUS | Status: AC
  Filled 2017-04-14: qty 20

## 2017-04-14 MED ORDER — DEXAMETHASONE SODIUM PHOSPHATE 10 MG/ML IJ SOLN
INTRAMUSCULAR | Status: AC
  Filled 2017-04-14: qty 1

## 2017-04-14 MED ORDER — STERILE WATER FOR IRRIGATION IR SOLN
Status: DC | PRN
  Administered 2017-04-14: 3000 mL

## 2017-04-14 MED ORDER — LIDOCAINE 2% (20 MG/ML) 5 ML SYRINGE
INTRAMUSCULAR | Status: AC
  Filled 2017-04-14: qty 5

## 2017-04-14 MED ORDER — HYDROCODONE-ACETAMINOPHEN 10-325 MG PO TABS: 1.0000 | ORAL_TABLET | ORAL | 0 refills | Status: DC | PRN

## 2017-04-14 SURGICAL SUPPLY — 36 items
BAG DRAIN URO-CYSTO SKYTR STRL (DRAIN) ×2 IMPLANT
BAG DRN UROCATH (DRAIN) ×1
BASKET LASER NITINOL 1.9FR (BASKET) IMPLANT
BASKET STNLS GEMINI 4WIRE 3FR (BASKET) IMPLANT
BASKET ZERO TIP NITINOL 2.4FR (BASKET) IMPLANT
BSKT STON RTRVL 120 1.9FR (BASKET)
BSKT STON RTRVL GEM 120X11 3FR (BASKET)
BSKT STON RTRVL ZERO TP 2.4FR (BASKET)
CATH INTERMIT  6FR 70CM (CATHETERS) IMPLANT
CATH URET 5FR 28IN CONE TIP (BALLOONS)
CATH URET 5FR 70CM CONE TIP (BALLOONS) IMPLANT
CLOTH BEACON ORANGE TIMEOUT ST (SAFETY) ×4 IMPLANT
ELECT REM PT RETURN 9FT ADLT (ELECTROSURGICAL)
ELECTRODE REM PT RTRN 9FT ADLT (ELECTROSURGICAL) IMPLANT
FIBER LASER FLEXIVA 1000 (UROLOGICAL SUPPLIES) IMPLANT
FIBER LASER FLEXIVA 365 (UROLOGICAL SUPPLIES) IMPLANT
FIBER LASER FLEXIVA 550 (UROLOGICAL SUPPLIES) IMPLANT
FIBER LASER TRAC TIP (UROLOGICAL SUPPLIES) IMPLANT
GLOVE BIO SURGEON STRL SZ8 (GLOVE) ×2 IMPLANT
GOWN STRL REUS W/ TWL XL LVL3 (GOWN DISPOSABLE) ×1 IMPLANT
GOWN STRL REUS W/TWL XL LVL3 (GOWN DISPOSABLE) ×2
GUIDEWIRE ANG ZIPWIRE 038X150 (WIRE) IMPLANT
GUIDEWIRE STR DUAL SENSOR (WIRE) IMPLANT
INFUSOR MANOMETER BAG 3000ML (MISCELLANEOUS) ×2 IMPLANT
IV NS IRRIG 3000ML ARTHROMATIC (IV SOLUTION) ×4 IMPLANT
KIT BALLIN UROMAX 15FX10 (LABEL) IMPLANT
KIT BALLN UROMAX 15FX4 (MISCELLANEOUS) IMPLANT
KIT BALLN UROMAX 26 75X4 (MISCELLANEOUS) ×1
KIT RM TURNOVER CYSTO AR (KITS) ×2 IMPLANT
MANIFOLD NEPTUNE II (INSTRUMENTS) IMPLANT
PACK CYSTO (CUSTOM PROCEDURE TRAY) ×2 IMPLANT
SET HIGH PRES BAL DIL (LABEL)
SHEATH ACCESS URETERAL 38CM (SHEATH) ×1 IMPLANT
SYRINGE IRR TOOMEY STRL 70CC (SYRINGE) IMPLANT
TUBE CONNECTING 12X1/4 (SUCTIONS) IMPLANT
WATER STERILE IRR 3000ML UROMA (IV SOLUTION) ×1 IMPLANT

## 2017-04-14 NOTE — Anesthesia Preprocedure Evaluation (Signed)
Anesthesia Evaluation  Patient identified by MRN, date of birth, ID band Patient awake    Reviewed: Allergy & Precautions, NPO status , Patient's Chart, lab work & pertinent test results, reviewed documented beta blocker date and time   Airway Mallampati: II  TM Distance: >3 FB Neck ROM: Full    Dental no notable dental hx.    Pulmonary neg pulmonary ROS,    Pulmonary exam normal breath sounds clear to auscultation       Cardiovascular Exercise Tolerance: Good hypertension, Pt. on medications and Pt. on home beta blockers Normal cardiovascular exam+ dysrhythmias Atrial Fibrillation and Supra Ventricular Tachycardia  Rhythm:Regular Rate:Normal     Neuro/Psych negative neurological ROS  negative psych ROS   GI/Hepatic Neg liver ROS, hiatal hernia, GERD  Medicated,S/p whipple    Endo/Other  diabetes, Well Controlled, Type 2  Renal/GU Renal diseaseBladder tumor  negative genitourinary   Musculoskeletal negative musculoskeletal ROS (+)   Abdominal   Peds negative pediatric ROS (+)  Hematology negative hematology ROS (+)   Anesthesia Other Findings   Reproductive/Obstetrics negative OB ROS                             Lab Results  Component Value Date   WBC 4.3 10/30/2016   HGB 12.7 10/30/2016   HCT 36.8 10/30/2016   MCV 88.7 10/30/2016   PLT 152 10/30/2016   Lab Results  Component Value Date   CREATININE 0.94 10/30/2016   BUN 21 (H) 10/30/2016   NA 137 10/30/2016   K 4.3 10/30/2016   CL 106 10/30/2016   CO2 26 10/30/2016    Anesthesia Physical  Anesthesia Plan  ASA: III  Anesthesia Plan: General   Post-op Pain Management:    Induction: Intravenous  PONV Risk Score and Plan:   Airway Management Planned: LMA  Additional Equipment:   Intra-op Plan:   Post-operative Plan: Extubation in OR  Informed Consent: I have reviewed the patients History and Physical, chart,  labs and discussed the procedure including the risks, benefits and alternatives for the proposed anesthesia with the patient or authorized representative who has indicated his/her understanding and acceptance.   Dental advisory given  Plan Discussed with: CRNA  Anesthesia Plan Comments:         Anesthesia Quick Evaluation

## 2017-04-14 NOTE — Transfer of Care (Signed)
Immediate Anesthesia Transfer of Care Note  Patient: Jennifer Wall  Procedure(s) Performed: Procedure(s) (LRB): CYSTOSCOPY/RETROGRADE/URETEROSCOPY/ FULGURATION OF UPPER POLE TUMOR (Right)  Patient Location: PACU  Anesthesia Type: General  Level of Consciousness: awake, oriented, sedated and patient cooperative  Airway & Oxygen Therapy: Patient Spontanous Breathing and Patient connected to face mask oxygen  Post-op Assessment: Report given to PACU RN and Post -op Vital signs reviewed and stable  Post vital signs: Reviewed and stable  Complications: No apparent anesthesia complications  Last Vitals:  Vitals:   04/14/17 0924 04/14/17 1145  BP: (!) 119/52 106/64  Pulse: (!) 41 (!) 40  Resp: 16 11  Temp: 36.5 C (!) 36.3 C  SpO2: 99% 100%    Last Pain:  Vitals:   04/14/17 0924  TempSrc: Oral      Patients Stated Pain Goal: 7 (04/14/17 0937)

## 2017-04-14 NOTE — Anesthesia Postprocedure Evaluation (Signed)
Anesthesia Post Note  Patient: NEFTALI THUROW  Procedure(s) Performed: Procedure(s) (LRB): CYSTOSCOPY/RETROGRADE/URETEROSCOPY/ FULGURATION OF UPPER POLE TUMOR (Right)     Patient location during evaluation: PACU Anesthesia Type: General Level of consciousness: awake and alert Pain management: pain level controlled Vital Signs Assessment: post-procedure vital signs reviewed and stable Respiratory status: spontaneous breathing, nonlabored ventilation, respiratory function stable and patient connected to nasal cannula oxygen Cardiovascular status: blood pressure returned to baseline and stable Postop Assessment: no signs of nausea or vomiting Anesthetic complications: no    Last Vitals:  Vitals:   04/14/17 1201 04/14/17 1215  BP: (!) 118/52 115/60  Pulse: (!) 40 63  Resp: 20 17  Temp:    SpO2: 100% 96%    Last Pain:  Vitals:   04/14/17 0924  TempSrc: Leatrice Jewels                 Tiajuana Amass

## 2017-04-14 NOTE — Anesthesia Procedure Notes (Signed)
Procedure Name: LMA Insertion Date/Time: 04/14/2017 10:44 AM Performed by: Denna Haggard D Pre-anesthesia Checklist: Patient identified, Emergency Drugs available, Suction available and Patient being monitored Patient Re-evaluated:Patient Re-evaluated prior to induction Oxygen Delivery Method: Circle system utilized Preoxygenation: Pre-oxygenation with 100% oxygen Induction Type: IV induction Ventilation: Mask ventilation without difficulty LMA: LMA inserted LMA Size: 4.0 Number of attempts: 1 Airway Equipment and Method: Bite block Placement Confirmation: positive ETCO2 Tube secured with: Tape Dental Injury: Teeth and Oropharynx as per pre-operative assessment

## 2017-04-14 NOTE — Discharge Instructions (Signed)
Cystoscopy patient instructions ° °Following a cystoscopy, a catheter (a flexible rubber tube) is sometimes left in place to empty the bladder. This may cause some discomfort or a feeling that you need to urinate. Your doctor determines the period of time that the catheter will be left in place. °You may have bloody urine for two to three days (Call your doctor if the amount of bleeding increases or does not subside). ° °You may pass blood clots in your urine, especially if you had a biopsy. It is not unusual to pass small blood clots and have some bloody urine a couple of weeks after your cystoscopy. Again, call your doctor if the bleeding does not subside. °You may have: °Dysuria (painful urination) °Frequency (urinating often) °Urgency (strong desire to urinate) ° °These symptoms are common especially if medicine is instilled into the bladder or a ureteral stent is placed. Avoiding alcohol and caffeine, such as coffee, tea, and chocolate, may help relieve these symptoms. Drink plenty of water, unless otherwise instructed. Your doctor may also prescribe an antibiotic or other medicine to reduce these symptoms. ° °Cystoscopy results are available soon after the procedure; biopsy results usually take two to four days. Your doctor will discuss the results of your exam with you. Before you go home, you will be given specific instructions for follow-up care. °Special Instructions: ° °1  If you are going home with a catheter in place do not take a tub bath until removed by your doctor.  °2  You may resume your normal activities.  °3  Do not drive or operate machinery if you are taking narcotic pain medicine.  °4  Be sure to keep all follow-up appointments with your doctor.  ° °5 Call Your Doctor If: The catheter is not draining ° You have severe pain ° You are unable to urinate ° You have a fever over 101 ° You have severe bleeding °        ° °Post Anesthesia Home Care Instructions ° °Activity: °Get plenty of rest for  the remainder of the day. A responsible individual must stay with you for 24 hours following the procedure.  °For the next 24 hours, DO NOT: °-Drive a car °-Operate machinery °-Drink alcoholic beverages °-Take any medication unless instructed by your physician °-Make any legal decisions or sign important papers. ° °Meals: °Start with liquid foods such as gelatin or soup. Progress to regular foods as tolerated. Avoid greasy, spicy, heavy foods. If nausea and/or vomiting occur, drink only clear liquids until the nausea and/or vomiting subsides. Call your physician if vomiting continues. ° °Special Instructions/Symptoms: °Your throat may feel dry or sore from the anesthesia or the breathing tube placed in your throat during surgery. If this causes discomfort, gargle with warm salt water. The discomfort should disappear within 24 hours. ° °If you had a scopolamine patch placed behind your ear for the management of post- operative nausea and/or vomiting: ° °1. The medication in the patch is effective for 72 hours, after which it should be removed.  Wrap patch in a tissue and discard in the trash. Wash hands thoroughly with soap and water. °2. You may remove the patch earlier than 72 hours if you experience unpleasant side effects which may include dry mouth, dizziness or visual disturbances. °3. Avoid touching the patch. Wash your hands with soap and water after contact with the patch. °  ° °

## 2017-04-14 NOTE — Op Note (Signed)
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 Cystoscopy with right retrograde pyelogram including interpretation. 2. Right ureteroscopy with incision of right upper pole infundibular stenosis. 3. Fulguration of 1 mm upper pole calyceal tumor. 4. Fluoroscopy use less than 1 hour.  SURGEON:  Claybon Jabs  INDICATION: Jennifer Wall is a 71 year old female who was found to have hematuria coming from her right ureteral orifice and underwent ureteroscopy with the finding of a tumor in an upper pole calyx. This initially occurred in 2/17 and I fulgurated the tumor after biopsying it. A repeat ureteroscopy for second look was undertaken in 3/17 with no evidence of residual tumor. Follow-up imaging revealed no definite evidence of recurrence however in 10/17 repeat ureteroscopy revealed a small area of recurrence which was fulgurated at that time. She is brought to the operating room today for repeat ureteroscopy and fulguration of any lesion identified as she has elected to proceed with conservative therapy rather then a nephroureterectomy. I believe that this is an entirely reasonable approach in this patient.  ANESTHESIA:  General  EBL:  Minimal  DRAINS: None  LOCAL MEDICATIONS USED:  None  SPECIMEN:  none  Description of procedure: After informed consent the patient was taken to the operating room and placed on the table in a supine position. General anesthesia was then administered. Once fully anesthetized the patient was moved to the dorsal lithotomy position and the genitalia were sterilely prepped and draped in standard fashion. An official timeout was then performed.  She has a grade 3-4 cystocele so I placed packing in the vagina to elevate the trigone  and this allowed me to identify the right ureteral orifice without difficulty. This was performed by introducing the 61 French rigid cystoscope with 30 lens into the bladder and the bladder was then fully and systematically inspected with no tumors, stones or lesions noted within the bladder. I then passed a 6 [place] open-ended ureteral catheter through the cystoscope and into the right ureteral orifice in preparation for right retrograde pyelogram.  Full-strength Omnipaque contrast was injected under direct fluoroscopy up the right ureter through the open-ended catheter and this revealed a normal ureter throughout its length. The intrarenal collecting system appeared normal. The upper pole calyceal system was previously bifid with the tumor being located in the more inferior portion of the upper pole system however this did not appear to fill out with contrast. Through the open-ended catheter I then passed a 0.038 inch floppy tip sensor guidewire into the upper pole and left this in place and removed the open-ended catheter.  I chose a 6 [place] digital, flexible ureteroscope and passed this over the guidewire and up into the upper pole of the right kidney. I removed the guidewire and noted the upper pole calyx that I was easily able to access appeared entirely normal. I was able to find the location of the other calyx and it had stenosed down. Through the infundibular stenosis was a 1 mm papillary lesion consistent with her low-grade transitional cell carcinoma. I switched from saline to water and used the Bugbee electrode through the ureteroscope and first used cut and was able to incise the stenosed infundibulum in a cruciate fashion. This allowed me to advance the flexible ureteroscope into the calyx and I then repeated  pyelography by injecting full-strength contrast again under fluoroscopy and noted no definite filling defect. I was able to see an area that looked slightly suspicious and therefore  fulgurated this with the Bugbee and location where the tumor had originated initially was fulgurated completely.  Reinspection revealed no evidence of any further tumor. I therefore removed the ureteroscope and elected not to place a double-J stent due to the minimally invasive nature of this procedure.  My plan will be to repeat ureteroscopy again in ~10-12 months    PLAN OF CARE: Discharge to home after PACU  PATIENT DISPOSITION:  PACU - hemodynamically stable.

## 2017-04-15 ENCOUNTER — Encounter (HOSPITAL_BASED_OUTPATIENT_CLINIC_OR_DEPARTMENT_OTHER): Payer: Self-pay | Admitting: Urology

## 2017-04-16 ENCOUNTER — Other Ambulatory Visit: Payer: Medicare Other

## 2017-04-17 ENCOUNTER — Ambulatory Visit
Admission: RE | Admit: 2017-04-17 | Discharge: 2017-04-17 | Disposition: A | Discharge status: Home / Self Care | Payer: Medicare Other | Source: Ambulatory Visit | Attending: Internal Medicine | Admitting: Internal Medicine

## 2017-04-17 DIAGNOSIS — E041 Nontoxic single thyroid nodule: Secondary | ICD-10-CM

## 2017-11-05 DIAGNOSIS — C661 Malignant neoplasm of right ureter: Secondary | ICD-10-CM

## 2017-11-05 HISTORY — DX: Malignant neoplasm of right ureter: C66.1

## 2018-03-16 ENCOUNTER — Ambulatory Visit
Admission: RE | Admit: 2018-03-16 | Discharge: 2018-03-16 | Disposition: A | Discharge status: Home / Self Care | Payer: Medicare Other | Source: Ambulatory Visit | Attending: Internal Medicine | Admitting: Internal Medicine

## 2018-03-16 ENCOUNTER — Other Ambulatory Visit: Payer: Self-pay | Admitting: Internal Medicine

## 2018-03-16 DIAGNOSIS — E041 Nontoxic single thyroid nodule: Secondary | ICD-10-CM

## 2018-08-22 DIAGNOSIS — N390 Urinary tract infection, site not specified: Secondary | ICD-10-CM | POA: Insufficient documentation

## 2018-08-22 DIAGNOSIS — Z6824 Body mass index (BMI) 24.0-24.9, adult: Secondary | ICD-10-CM | POA: Insufficient documentation

## 2018-09-17 ENCOUNTER — Other Ambulatory Visit: Payer: Self-pay | Admitting: Internal Medicine

## 2018-09-17 DIAGNOSIS — M8580 Other specified disorders of bone density and structure, unspecified site: Secondary | ICD-10-CM

## 2018-09-17 DIAGNOSIS — E041 Nontoxic single thyroid nodule: Secondary | ICD-10-CM

## 2018-09-22 ENCOUNTER — Ambulatory Visit
Admission: RE | Admit: 2018-09-22 | Discharge: 2018-09-22 | Disposition: A | Discharge status: Home / Self Care | Payer: Medicare Other | Source: Ambulatory Visit | Attending: Internal Medicine | Admitting: Internal Medicine

## 2018-09-22 DIAGNOSIS — M8580 Other specified disorders of bone density and structure, unspecified site: Secondary | ICD-10-CM

## 2018-10-15 DIAGNOSIS — E1169 Type 2 diabetes mellitus with other specified complication: Secondary | ICD-10-CM | POA: Insufficient documentation

## 2018-10-15 DIAGNOSIS — E785 Hyperlipidemia, unspecified: Secondary | ICD-10-CM | POA: Insufficient documentation

## 2018-10-16 ENCOUNTER — Encounter: Payer: Self-pay | Admitting: Cardiology

## 2018-10-16 ENCOUNTER — Ambulatory Visit (INDEPENDENT_AMBULATORY_CARE_PROVIDER_SITE_OTHER): Payer: Medicare Other | Admitting: Cardiology

## 2018-10-16 VITALS — BP 106/64 | HR 57 | Ht 62.0 in | Wt 123.4 lb

## 2018-10-16 DIAGNOSIS — I480 Paroxysmal atrial fibrillation: Secondary | ICD-10-CM

## 2018-10-16 DIAGNOSIS — Z7901 Long term (current) use of anticoagulants: Secondary | ICD-10-CM

## 2018-10-16 DIAGNOSIS — E785 Hyperlipidemia, unspecified: Secondary | ICD-10-CM | POA: Diagnosis not present

## 2018-10-16 DIAGNOSIS — I119 Hypertensive heart disease without heart failure: Secondary | ICD-10-CM

## 2018-10-16 NOTE — Progress Notes (Signed)
Cardiology Office Note:    Date:  10/16/2018   ID:  AALYAH Wall, DOB 11-24-1945, MRN [id number]  PCP:  Jennifer Cruel, MD  Cardiologist:  Jennifer More, MD    Referring MD: Jennifer Cruel, MD    ASSESSMENT:    1. PAF (paroxysmal atrial fibrillation) ([place])   2. Chronic anticoagulation   3. Hypertensive heart disease without heart failure   4. Hyperlipidemia, unspecified hyperlipidemia type    PLAN:    In order of problems listed above:  1. Stable no clinical recurrence she was asymptomatic and 2 episodes in the past advised to purchase an iPhone adapter to screen EKG several times a week at home contact me if she is having atrial fibrillation continue beta-blocker anticoagulant.  If having a significant atrial fibrillation recurrence would benefit from an antiarrhythmic drug 2. Stable continue anticoagulant 3. Stable continue current antihypertensive blood pressure target 4. Stable continue her statin   Next appointment: 1 year or sooner if clinical recurrence of atrial fibrillation   Medication Adjustments/Labs and Tests Ordered: Current medicines are reviewed at length with the patient today.  Concerns regarding medicines are outlined above.  Orders Placed This Encounter  Procedures  . EKG 12-Lead   No orders of the defined types were placed in this encounter.   No chief complaint on file.   History of Present Illness:    Jennifer Wall is a 73 y.o. female with a hx of PAF  CHADS2 vasc =4 anticoagulated with hypertension last seen 12/20/17. Compliance with diet, lifestyle and medications: Yes  She is pleased with the quality of her life has had no clinical recurrence of atrial fibrillation tolerates anticoagulant without bleeding.  No chest pain shortness of breath palpitations syncope or TIA.  Difficult issue as she did not know the 2 times she had atrial fibrillation she was in a heart rhythm.  We discussed her purchasing an iPhone adapter to record EKGs to  screen and she is having frequent episodes of atrial fibrillation with significant burden to benefit from antiarrhythmic drug.  At this time I would not withdraw anticoagulants she agrees Past Medical History:  Diagnosis Date  . Basal cell carcinoma 04/17/2015  . Bilateral renal cysts    incidental finding on CT 06/ 2016  . Diabetes mellitus type 2, noninsulin dependent ([place]) 09/06/2016  . Duplicated ureter, right    per urologist-- dr Jennifer Wall  . Essential hypertension 03/23/2015  . GERD (gastroesophageal reflux disease)    mild- controls with diet  . Hiatal hernia   . History of acute pancreatitis    01-14-2006  ([facility])  idiopathic-  resolved  . History of atrial fibrillation without current medication currently 09-18-2015 pt denies S & S   post op 04-14-2015 whipple procedure (per pt had work-up done by [facility] cardiologist- pt was released from cardiology care -- no meds or asa)  . History of basal cell carcinoma excision    multiple skin removal  . History of colon polyps    hyperplastic  . 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 History of melanoma excision    1989 left thigh  . History of pancreatic surgery    08/ 2016  s/p  whipple for neuroendocrine neoplasm tumor  . 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 Hyperlipidemia   . Hypertension   . Melanoma ([place]) 04/17/2015  . Postoperative atrial fibrillation ([place]) 05/22/2015  . 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 Supraventricular tachycardia ([place])    diagnosed at [facility] by monitor 2017  . Supraventricular  tachycardia, paroxysmal ([place]) 06/12/2015  . Type 2 diabetes mellitus ([place])   . 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 Ventral hernia     Past Surgical History:  Procedure Laterality Date  . CATARACT EXTRACTION W/ INTRAOCULAR LENS  IMPLANT, BILATERAL  2010  . COLONOSCOPY  last one 2015  . CYSTOSCOPY WITH RETROGRADE PYELOGRAM, URETEROSCOPY AND STENT PLACEMENT Right 09/22/2015   Procedure: CYSTOSCOPY WITH RIGHT RETROGRADE PYELOGRAM, URETEROSCOPY AND STENT PLACEMENT, BIOPSY;  Surgeon: Kathie Rhodes, MD;  Location: [place];  Service: Urology;  Laterality: Right;  . CYSTOSCOPY WITH RETROGRADE PYELOGRAM, URETEROSCOPY AND STENT PLACEMENT Right 11/13/2015   Procedure: CYSTOSCOPY WITH RIGHT RETROGRADE PYELOGRAM, URETEROSCOPY BIOPSY, FULGURATION  AND STENT ;  Surgeon: Kathie Rhodes, MD;  Location: [place];  Service: Urology;  Laterality: Right;  . CYSTOSCOPY WITH URETEROSCOPY AND STENT PLACEMENT Right 06/17/2016   Procedure: CYSTOSCOPY , URETEROSCOPY  WITH  FULGUTATION;  Surgeon: Kathie Rhodes, MD;  Location: [facility];  Service: Urology;  Laterality: Right;  . CYSTOSCOPY/RETROGRADE/URETEROSCOPY Right 04/14/2017   Procedure: CYSTOSCOPY/RETROGRADE/URETEROSCOPY/ FULGURATION OF UPPER POLE TUMOR;  Surgeon: Kathie Rhodes, MD;  Location: [facility];  Service: Urology;  Laterality: Right;  . DIAGNOSTIC LAPAROSCOPY  04/14/2015   per office note from [facility] 07/2015  . EUS N/A 02/22/2015   Procedure: UPPER ENDOSCOPIC ULTRASOUND (EUS) RADIAL;  Surgeon: Arta Silence, MD;  Location: [facility];  Service: Endoscopy;  Laterality: N/A;  . KIDNEY SURGERY Right    right kindey removed  . LUMBAR DISC SURGERY  2000   L4 - L5  . MELANOMA EXCISION  1989   left thigh  . PANCREATICODUODENECTOMY  04-14-2015   at [facility]   malignant neuroendocrine pancreatic tumor  . PULLEY RELEASE RIGHT THUMB  02-01-2005    Current Medications: Current Meds  Medication  Sig  . acetaminophen (TYLENOL) 500 MG tablet Take 1,000 mg by mouth every 6 (six) hours as needed for moderate pain or headache.  Marland Kitchen apixaban (ELIQUIS) 5 MG TABS tablet Take 5 mg by mouth 2 (two) times daily.  . Ascorbic Acid (VITAMIN C) 500 MG CAPS Take 1 capsule by mouth every morning.  . Calcium Carb-Cholecalciferol (CALCIUM 600 + D PO) Take 2 tablets by mouth daily.  . Cholecalciferol (VITAMIN D) 2000 UNITS CAPS Take 1 capsule by mouth 2 (two) times daily.  . citalopram (CELEXA) 10 MG tablet Take 10 mg by mouth every morning.  Marland Kitchen glimepiride (AMARYL) 1 MG tablet Take 1 mg by mouth daily with breakfast.  . lisinopril (PRINIVIL,ZESTRIL) 40 MG tablet Take 40 mg by mouth every morning.  . metoprolol succinate (TOPROL-XL) 50 MG 24 hr tablet Take 50 mg by mouth every morning. Take with or immediately following a meal.   . omega-3 acid ethyl esters (LOVAZA) 1 G capsule Take 2 g by mouth 2 (two) times daily.  . pantoprazole (PROTONIX) 40 MG tablet Take 40 mg by mouth every morning.  . simvastatin (ZOCOR) 40 MG tablet Take 40 mg by mouth at  bedtime.  Marland Kitchen spironolactone (ALDACTONE) 25 MG tablet Take 25 mg by mouth daily.  Marland Kitchen triamcinolone (NASACORT) 55 MCG/ACT AERO nasal inhaler Place 1 spray into the nose daily as needed.     Allergies:   Nitrofurantoin; Fenofibrate micronized; Metformin; Metformin and related; Tricor [fenofibrate]; and Erythromycin   Social History   Socioeconomic History  . Marital status: Married    Spouse name: Not on file  . Number of children: Not on file  . Years of education: Not on file  . Highest education level: Not on file  Occupational History  . Not on file  Social Needs  . Financial resource strain: Not on file  . Food insecurity:    Worry: Not on file    Inability: Not on file  . Transportation needs:    Medical: Not on file    Non-medical: Not on file  Tobacco Use  . Smoking status: Never Smoker  . Smokeless tobacco: Never Used  Substance and Sexual  Activity  . Alcohol use: Yes    Alcohol/week: 2.0 standard drinks    Types: 2 Glasses of wine per week    Comment: occasional  . Drug use: No  . Sexual activity: Not on file  Lifestyle  . Physical activity:    Days per week: Not on file    Minutes per session: Not on file  . Stress: Not on file  Relationships  . Social connections:    Talks on phone: Not on file    Gets together: Not on file    Attends religious service: Not on file    Active member of club or organization: Not on file    Attends meetings of clubs or organizations: Not on file    Relationship status: Not on file  Other Topics Concern  . Not on file  Social History Narrative  . Not on file     Family History: The patient's family history includes Breast cancer in her sister; Cancer in her mother; Hypertension in her father and sister. ROS:   Please see the history of present illness.    All other systems reviewed and are negative.  EKGs/Labs/Other Studies Reviewed:    The following studies were reviewed today:  EKG:  EKG ordered today and personally reviewed.  The ekg ordered today demonstrates [place] normal EKG  Recent Labs:   03/16/2018 cholesterol 115 HDL 51 LDL 34 creatinine 1.16 potassium 4 5 TSH normal hemoglobin 12.9. No results found for requested labs within last 8760 hours.  Recent Lipid Panel No results found for: CHOL, TRIG, HDL, CHOLHDL, VLDL, LDLCALC, LDLDIRECT  Physical Exam:    VS:  BP 106/64 (BP Location: Right Arm, Patient Position: Sitting, Cuff Size: Normal)   Pulse (!) 57   Ht 5\' 2"  (1.575 m)   Wt 123 lb 6.4 oz (56 kg)   SpO2 98%   BMI 22.57 kg/m     Wt Readings from Last 3 Encounters:  10/16/18 123 lb 6.4 oz (56 kg)  04/14/17 122 lb 8 oz (55.6 kg)  06/17/16 119 lb 5 oz (54.1 kg)     GEN:  Well nourished, well developed in no acute distress HEENT: Normal NECK: No JVD; No carotid bruits LYMPHATICS: No lymphadenopathy CARDIAC: RRR, no murmurs, rubs, gallops RESPIRATORY:   Clear to auscultation without rales, wheezing or rhonchi  ABDOMEN: Soft, non-tender, non-distended MUSCULOSKELETAL:  No edema; No deformity  SKIN: Warm and dry NEUROLOGIC:  Alert and oriented x 3 PSYCHIATRIC:  Normal affect  Signed, Jennifer More, MD  10/16/2018 11:05 AM    [place]

## 2018-10-16 NOTE — Patient Instructions (Addendum)
Medication Instructions:  Your physician recommends that you continue on your current medications as directed. Please refer to the Current Medication list given to you today.  If you need a refill on your cardiac medications before your next appointment, please call your pharmacy.   Lab work: NONE If you have labs (blood work) drawn today and your tests are completely normal, you will receive your results only by: . MyChart Message (if you have MyChart) OR . A paper copy in the mail If you have any lab test that is abnormal or we need to change your treatment, we will call you to review the results.  Testing/Procedures: You had an EKG today  Follow-Up: At [facility], you and your health needs are our priority.  As part of our continuing mission to provide you with exceptional heart care, we have created designated Provider Care Teams.  These Care Teams include your primary Cardiologist (physician) and Advanced Practice Providers (APPs -  Physician Assistants and Nurse Practitioners) who all work together to provide you with the care you need, when you need it. You will need a follow up appointment in 1 years.  Please call our office 2 months in advance to schedule this appointment.  KardiaMobile [email or web address]   FDA-cleared, clinical grade mobile EKG monitor: Kardia is the most clinically-validated mobile EKG used by the world's leading cardiac care medical professionals With Basic service, know instantly if your heart rhythm is normal or if atrial fibrillation is detected, and email the last single EKG recording to yourself or your doctor Premium service, available for purchase through the Kardia app for $9.99 per month or $99 per year, includes unlimited history and storage of your EKG recordings, a monthly EKG summary report to share with your doctor, along with the ability to track your blood pressure, activity and weight Includes one KardiaMobile phone  clip FREE SHIPPING: Standard delivery 1-3 business days. Orders placed by 11:00am PST will ship that afternoon. Otherwise, will ship next business day. All orders ship via USPS Priority Mail from [place], [place]     

## 2019-01-20 ENCOUNTER — Other Ambulatory Visit: Payer: Self-pay | Admitting: Cardiology

## 2019-01-20 MED ORDER — APIXABAN 5 MG PO TABS: 5.0000 mg | ORAL_TABLET | Freq: Two times a day (BID) | ORAL | 1 refills | Status: DC

## 2019-01-20 NOTE — Telephone Encounter (Signed)
°*  STAT* If patient is at the pharmacy, call can be transferred to refill team.   1. Which medications need to be refilled? (please list name of each medication and dose if known) apixaban (ELIQUIS) 5 MG TABS tablet  2. Which pharmacy/location (including street and city if local pharmacy) is medication to be sent to? [facility] 3. Do they need a 30 day or 90 day supply? 90 day

## 2019-01-20 NOTE — Telephone Encounter (Signed)
Refilled eliquis 5 mg twice daily with [facility].

## 2019-02-22 ENCOUNTER — Ambulatory Visit
Admission: RE | Admit: 2019-02-22 | Discharge: 2019-02-22 | Disposition: A | Discharge status: Home / Self Care | Payer: Medicare Other | Source: Ambulatory Visit | Attending: Internal Medicine | Admitting: Internal Medicine

## 2019-02-22 DIAGNOSIS — E041 Nontoxic single thyroid nodule: Secondary | ICD-10-CM

## 2019-03-22 ENCOUNTER — Ambulatory Visit: Payer: Medicare Other | Admitting: Physical Therapy

## 2019-04-28 ENCOUNTER — Ambulatory Visit (INDEPENDENT_AMBULATORY_CARE_PROVIDER_SITE_OTHER): Payer: Medicare Other | Admitting: Podiatry

## 2019-04-28 ENCOUNTER — Ambulatory Visit (INDEPENDENT_AMBULATORY_CARE_PROVIDER_SITE_OTHER): Payer: Medicare Other

## 2019-04-28 ENCOUNTER — Other Ambulatory Visit: Payer: Self-pay | Admitting: Podiatry

## 2019-04-28 ENCOUNTER — Other Ambulatory Visit: Payer: Self-pay

## 2019-04-28 ENCOUNTER — Encounter: Payer: Self-pay | Admitting: Podiatry

## 2019-04-28 DIAGNOSIS — M779 Enthesopathy, unspecified: Secondary | ICD-10-CM | POA: Diagnosis not present

## 2019-04-28 DIAGNOSIS — M79672 Pain in left foot: Secondary | ICD-10-CM | POA: Diagnosis not present

## 2019-04-28 DIAGNOSIS — M79671 Pain in right foot: Secondary | ICD-10-CM

## 2019-05-01 NOTE — Progress Notes (Signed)
Subjective:   Patient ID: Jennifer Wall, female   DOB: 73 y.o.   MRN: [id number]   HPI Patient presents stating at times her feet will burn at night and can become aggravating but she is not sure necessarily of cause and states is been going on for the last few months.  Patient does not smoke likes to be active   Review of Systems  All other systems reviewed and are negative.       Objective:  Physical Exam Vitals signs and nursing note reviewed.  Constitutional:      Appearance: She is well-developed.  Pulmonary:     Effort: Pulmonary effort is normal.  Musculoskeletal: Normal range of motion.  Skin:    General: Skin is warm.  Neurological:     Mental Status: She is alert.     Neurovascular status found to be moderately diminished with sharp dull vibratory intact and patient is found to have good digital perfusion.  Patient is noted to have mild diminishment in hair growth and I was not able to elicit areas of pain currently     Assessment:  Difficult to say whether this is tenderness inflammatory or other pathology which may be present     Plan:  H&P x-rays reviewed advised this patient on soaks therapy at night and trying to take a Tylenol to see if that will help mitigate symptoms.  Could consider gabapentin at one point in future  X-rays were negative for signs of fracture or other indications of advanced arthritic process

## 2019-07-10 ENCOUNTER — Other Ambulatory Visit: Payer: Self-pay | Admitting: Cardiology

## 2019-07-12 NOTE — Telephone Encounter (Signed)
Eliquis refill sent to [facility]

## 2019-08-31 ENCOUNTER — Encounter (HOSPITAL_COMMUNITY): Payer: Medicare Other

## 2019-09-20 ENCOUNTER — Ambulatory Visit: Payer: Medicare Other

## 2019-09-29 NOTE — Progress Notes (Signed)
Cardiology Office Note:    Date:  09/30/2019   ID:  Jennifer Wall, DOB 26-May-1946, MRN [id number]  PCP:  Lawerance Cruel, MD  Cardiologist:  Shirlee More, MD    Referring MD: Lawerance Cruel, MD    ASSESSMENT:    1. PAF (paroxysmal atrial fibrillation) ([place])   2. Chronic anticoagulation   3. Hypertensive heart disease without heart failure   4. Hyperlipidemia, unspecified hyperlipidemia type    PLAN:    In order of problems listed above:  1. Stable remains in sinus rhythm with resting bradycardia decrease her beta-blocker dose and continue anticoagulation 2. She will continue her anticoagulant no bleeding complication 3. Improved back on ACE inhibitor continue same reduced dose of beta-blocker continue to monitor at home no evidence of heart failure 4. Continue statin await labs from her primary care physician regarding liver function renal function and to profile   Next appointment: 6 months   Medication Adjustments/Labs and Tests Ordered: Current medicines are reviewed at length with the patient today.  Concerns regarding medicines are outlined above.  No orders of the defined types were placed in this encounter.  No orders of the defined types were placed in this encounter.   Chief Complaint  Patient presents with  . Follow-up    For atrial fibrillation    History of Present Illness:    Jennifer Wall is a 74 y.o. female with a hx of PAF  CHADS2 vasc =4 anticoagulated with hypertension  last seen 10/16/2018.  She has a history of pancreatic neuroendocrine tumor and has had Whipple procedure performed.  She is followed by [facility] for surveillance. Compliance with diet, lifestyle and medications: Yes  Concern last visit her ACE inhibitor was discontinued.  She had an episode recently where she was cardiac aware chest pain was apprehensive her systolics were greater than 160 put herself back on her ACE inhibitor her blood pressures at target.  She is a little bit  more bradycardic than before and will decrease her beta-blocker unaware of atrial fibrillation no bleeding complication of her anticoagulant has had no edema chest pain or shortness of breath. Past Medical History:  Diagnosis Date  . Basal cell carcinoma 04/17/2015  . Bilateral renal cysts    incidental finding on CT 06/ 2016  . Diabetes mellitus type 2, noninsulin dependent ([place]) 09/06/2016  . Duplicated ureter, right    per urologist-- dr Karsten Ro  . Essential hypertension 03/23/2015  . GERD (gastroesophageal reflux disease)    mild- controls with diet  . Hiatal hernia   . History of acute pancreatitis    01-14-2006  ([facility])  idiopathic-  resolved  . History of atrial fibrillation without current medication currently 09-18-2015 pt denies S & S   post op 04-14-2015 whipple procedure (per pt had work-up done by [facility] cardiologist- pt was released from cardiology care -- no meds or asa)  . History of basal cell carcinoma excision    multiple skin removal  . History of colon polyps    hyperplastic  . History of malignant neuroendocrine neoplasm followed by oncologist-- dr Manuella Ghazi at [facility]--- per last note 09-19-2016 no recurrence   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  s/p  whipple (T3 N0 M0, Grade 1) negative margins and nodes  . History of melanoma excision    1989 left thigh  . History of pancreatic surgery    08/ 2016  s/p  whipple for neuroendocrine neoplasm tumor  . 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 Hyperlipidemia   . Hypertension   . Melanoma ([place]) 04/17/2015  . Postoperative atrial fibrillation ([place]) 05/22/2015  . 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 Supraventricular tachycardia ([place])    diagnosed at [facility] by monitor 2017  .  Supraventricular tachycardia, paroxysmal ([place]) 06/12/2015  . Type 2 diabetes mellitus ([place])   . 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 Ventral hernia     Past Surgical History:  Procedure Laterality Date  . CATARACT EXTRACTION W/ INTRAOCULAR LENS  IMPLANT, BILATERAL  2010  . COLONOSCOPY  last one 2015  . CYSTOSCOPY WITH RETROGRADE PYELOGRAM, URETEROSCOPY AND STENT PLACEMENT Right 09/22/2015   Procedure: CYSTOSCOPY WITH RIGHT RETROGRADE PYELOGRAM, URETEROSCOPY AND STENT PLACEMENT, BIOPSY;  Surgeon: Kathie Rhodes, MD;  Location: [place];  Service: Urology;  Laterality: Right;  . CYSTOSCOPY WITH RETROGRADE PYELOGRAM, URETEROSCOPY AND STENT PLACEMENT Right 11/13/2015   Procedure: CYSTOSCOPY WITH RIGHT RETROGRADE PYELOGRAM, URETEROSCOPY BIOPSY, FULGURATION  AND STENT ;  Surgeon: Kathie Rhodes, MD;  Location: [place];  Service: Urology;  Laterality: Right;  . CYSTOSCOPY WITH URETEROSCOPY AND STENT PLACEMENT Right 06/17/2016   Procedure: CYSTOSCOPY , URETEROSCOPY  WITH  FULGUTATION;  Surgeon: Kathie Rhodes, MD;  Location: [facility];  Service: Urology;  Laterality: Right;  . CYSTOSCOPY/RETROGRADE/URETEROSCOPY Right 04/14/2017   Procedure: CYSTOSCOPY/RETROGRADE/URETEROSCOPY/ FULGURATION OF UPPER POLE TUMOR;  Surgeon: Kathie Rhodes, MD;  Location: [facility];  Service: Urology;  Laterality: Right;  . DIAGNOSTIC LAPAROSCOPY  04/14/2015   per office note from [facility] 07/2015  . EUS N/A 02/22/2015   Procedure: UPPER ENDOSCOPIC ULTRASOUND (EUS) RADIAL;  Surgeon: Arta Silence, MD;  [facility];  Service: Endoscopy;  Laterality: N/A;  . KIDNEY SURGERY Right    right kindey removed  . LUMBAR DISC SURGERY  2000   L4 - L5  . MELANOMA EXCISION  1989   left thigh  . PANCREATICODUODENECTOMY  04-14-2015   at [facility]   malignant neuroendocrine pancreatic tumor  . PULLEY RELEASE RIGHT THUMB  02-01-2005    Current Medications: Current  Meds  Medication Sig  . acetaminophen (TYLENOL) 500 MG tablet Take 1,000 mg by mouth every 6 (six) hours as needed for moderate pain or headache.  . Ascorbic Acid (VITAMIN C) 500 MG CAPS Take 1 capsule by mouth every morning.  . Calcium Carb-Cholecalciferol (CALCIUM 600 + D PO) Take 2 tablets by mouth daily.  . Cholecalciferol (VITAMIN D) 2000 UNITS CAPS Take 1 capsule by mouth 2 (two) times daily.  . citalopram (CELEXA) 10 MG tablet Take 10 mg by mouth every morning.  Marland Kitchen ELIQUIS 5 MG TABS tablet TAKE 1 TABLET BY MOUTH  TWICE DAILY  . glimepiride (AMARYL) 1 MG tablet Take 1 mg by mouth daily with breakfast.  . lisinopril (PRINIVIL,ZESTRIL) 40 MG tablet Take 40 mg by mouth every morning.  . metoprolol succinate (TOPROL-XL) 50 MG 24 hr tablet Take 50 mg by mouth every morning. Take with or immediately following a meal.   . omega-3 acid ethyl esters (LOVAZA) 1 G capsule Take 2 g by mouth 2 (two) times daily.  . pantoprazole (PROTONIX) 40 MG tablet Take 40 mg by mouth every morning.  . simvastatin (ZOCOR) 40 MG tablet Take 40 mg by mouth  at bedtime.  Marland Kitchen spironolactone (ALDACTONE) 25 MG tablet Take 25 mg by mouth daily.  Marland Kitchen triamcinolone (NASACORT) 55 MCG/ACT AERO nasal inhaler Place 1 spray into the nose daily as needed.     Allergies:   Nitrofurantoin, Fenofibrate micronized, Metformin, Metformin and related, Tricor [fenofibrate], and Erythromycin   Social History   Socioeconomic History  . Marital status: Married    Spouse name: Not on file  . Number of children: Not on file  . Years of education: Not on file  . Highest education level: Not on file  Occupational History  . Not on file  Tobacco Use  . Smoking status: Never Smoker  . Smokeless tobacco: Never Used  Substance and Sexual Activity  . Alcohol use: Yes    Alcohol/week: 2.0 standard drinks    Types: 2 Glasses of wine per week    Comment: occasional  . Drug use: Yes  . Sexual activity: Yes  Other Topics Concern  . Not on  file  Social History Narrative  . Not on file   Social Determinants of Health   Financial Resource Strain:   . Difficulty of Paying Living Expenses: Not on file  Food Insecurity:   . Worried About Charity fundraiser in the Last Year: Not on file  . Ran Out of Food in the Last Year: Not on file  Transportation Needs:   . Lack of Transportation (Medical): Not on file  . Lack of Transportation (Non-Medical): Not on file  Physical Activity:   . Days of Exercise per Week: Not on file  . Minutes of Exercise per Session: Not on file  Stress:   . Feeling of Stress : Not on file  Social Connections:   . Frequency of Communication with Friends and Family: Not on file  . Frequency of Social Gatherings with Friends and Family: Not on file  . Attends Religious Services: Not on file  . Active Member of Clubs or Organizations: Not on file  . Attends Archivist Meetings: Not on file  . Marital Status: Not on file     Family History: The patient's family history includes Breast cancer in her sister; Cancer in her mother; Hypertension in her father and sister. ROS:   Please see the history of present illness.    All other systems reviewed and are negative.  EKGs/Labs/Other Studies Reviewed:    The following studies were reviewed today:  EKG:  EKG ordered today and personally reviewed.  The ekg ordered today demonstrates this bradycardia 48 bpm was normal  Recent Labs: Followed with Dr. Harrington Challenger her primary care physician No results found for requested labs within last 8760 hours.  Recent Lipid Panel No results found for: CHOL, TRIG, HDL, CHOLHDL, VLDL, LDLCALC, LDLDIRECT  Physical Exam:    VS:  BP 112/82   Pulse (!) 48   Temp (!) 97.2 F (36.2 C)   Ht 5\' 2"  (1.575 m)   Wt 116 lb (52.6 kg)   SpO2 99%   BMI 21.22 kg/m     Wt Readings from Last 3 Encounters:  09/30/19 116 lb (52.6 kg)  10/16/18 123 lb 6.4 oz (56 kg)  04/14/17 122 lb 8 oz (55.6 kg)     GEN:  Well  nourished, well developed in no acute distress HEENT: Normal NECK: No JVD; No carotid bruits LYMPHATICS: No lymphadenopathy CARDIAC: RRR, no murmurs, rubs, gallops RESPIRATORY:  Clear to auscultation without rales, wheezing or rhonchi  ABDOMEN: Soft, non-tender, non-distended MUSCULOSKELETAL:  No edema; No deformity  SKIN: Warm and dry NEUROLOGIC:  Alert and oriented x 3 PSYCHIATRIC:  Normal affect    Signed, Shirlee More, MD  09/30/2019 12:07 PM    [place]

## 2019-09-30 ENCOUNTER — Ambulatory Visit (INDEPENDENT_AMBULATORY_CARE_PROVIDER_SITE_OTHER): Payer: Medicare PPO | Admitting: Cardiology

## 2019-09-30 ENCOUNTER — Other Ambulatory Visit: Payer: Self-pay

## 2019-09-30 ENCOUNTER — Telehealth: Payer: Self-pay | Admitting: *Deleted

## 2019-09-30 ENCOUNTER — Encounter: Payer: Self-pay | Admitting: Cardiology

## 2019-09-30 VITALS — BP 112/82 | HR 48 | Temp 97.2°F | Ht 62.0 in | Wt 116.0 lb

## 2019-09-30 DIAGNOSIS — I480 Paroxysmal atrial fibrillation: Secondary | ICD-10-CM

## 2019-09-30 DIAGNOSIS — I119 Hypertensive heart disease without heart failure: Secondary | ICD-10-CM | POA: Diagnosis not present

## 2019-09-30 DIAGNOSIS — Z7901 Long term (current) use of anticoagulants: Secondary | ICD-10-CM

## 2019-09-30 DIAGNOSIS — E785 Hyperlipidemia, unspecified: Secondary | ICD-10-CM

## 2019-09-30 MED ORDER — LISINOPRIL 40 MG PO TABS: 40.0000 mg | ORAL_TABLET | Freq: Every morning | ORAL | 3 refills | Status: DC

## 2019-09-30 NOTE — Patient Instructions (Signed)
Your physician has recommended you make the following change in your medication:  DECREASE TOPROL XL TO 58 MG [place]   Your physician wants you to follow-up in: [place] will receive a reminder letter in the mail two months in advance. If you don't receive a letter, please call our office to schedule the follow-up appointment.

## 2019-10-04 NOTE — Telephone Encounter (Signed)
error 

## 2019-10-17 ENCOUNTER — Other Ambulatory Visit: Payer: Self-pay | Admitting: Cardiology

## 2019-10-20 ENCOUNTER — Telehealth: Payer: Self-pay | Admitting: *Deleted

## 2019-10-20 MED ORDER — APIXABAN 5 MG PO TABS: 5.0000 mg | ORAL_TABLET | Freq: Two times a day (BID) | ORAL | 3 refills | Status: DC

## 2019-10-20 NOTE — Telephone Encounter (Signed)
Rx refill sent to pharmacy. Sent again because pt's pharmacy changed from [facility] to [facility]

## 2019-12-26 DIAGNOSIS — S7001XA Contusion of right hip, initial encounter: Secondary | ICD-10-CM | POA: Diagnosis not present

## 2020-02-16 DIAGNOSIS — Z8554 Personal history of malignant neoplasm of ureter: Secondary | ICD-10-CM | POA: Diagnosis not present

## 2020-02-16 DIAGNOSIS — C679 Malignant neoplasm of bladder, unspecified: Secondary | ICD-10-CM | POA: Diagnosis not present

## 2020-02-16 DIAGNOSIS — Z08 Encounter for follow-up examination after completed treatment for malignant neoplasm: Secondary | ICD-10-CM | POA: Diagnosis not present

## 2020-02-16 DIAGNOSIS — C661 Malignant neoplasm of right ureter: Secondary | ICD-10-CM | POA: Diagnosis not present

## 2020-02-17 DIAGNOSIS — D3A8 Other benign neuroendocrine tumors: Secondary | ICD-10-CM | POA: Diagnosis not present

## 2020-04-19 ENCOUNTER — Other Ambulatory Visit: Payer: Self-pay | Admitting: Internal Medicine

## 2020-04-19 DIAGNOSIS — E041 Nontoxic single thyroid nodule: Secondary | ICD-10-CM

## 2020-05-04 DIAGNOSIS — Z1231 Encounter for screening mammogram for malignant neoplasm of breast: Secondary | ICD-10-CM | POA: Diagnosis not present

## 2020-05-04 DIAGNOSIS — Z803 Family history of malignant neoplasm of breast: Secondary | ICD-10-CM | POA: Diagnosis not present

## 2020-05-22 ENCOUNTER — Ambulatory Visit
Admission: RE | Admit: 2020-05-22 | Discharge: 2020-05-22 | Disposition: A | Discharge status: Home / Self Care | Payer: Medicare PPO | Source: Ambulatory Visit | Attending: Internal Medicine | Admitting: Internal Medicine

## 2020-05-22 DIAGNOSIS — E041 Nontoxic single thyroid nodule: Secondary | ICD-10-CM | POA: Diagnosis not present

## 2020-05-26 DIAGNOSIS — I10 Essential (primary) hypertension: Secondary | ICD-10-CM | POA: Diagnosis not present

## 2020-05-26 DIAGNOSIS — Z0000 Encounter for general adult medical examination without abnormal findings: Secondary | ICD-10-CM | POA: Diagnosis not present

## 2020-05-26 DIAGNOSIS — E559 Vitamin D deficiency, unspecified: Secondary | ICD-10-CM | POA: Diagnosis not present

## 2020-05-26 DIAGNOSIS — E782 Mixed hyperlipidemia: Secondary | ICD-10-CM | POA: Diagnosis not present

## 2020-05-26 DIAGNOSIS — Z90411 Acquired partial absence of pancreas: Secondary | ICD-10-CM | POA: Diagnosis not present

## 2020-05-26 DIAGNOSIS — Z1389 Encounter for screening for other disorder: Secondary | ICD-10-CM | POA: Diagnosis not present

## 2020-05-26 DIAGNOSIS — K219 Gastro-esophageal reflux disease without esophagitis: Secondary | ICD-10-CM | POA: Diagnosis not present

## 2020-05-26 DIAGNOSIS — Z23 Encounter for immunization: Secondary | ICD-10-CM | POA: Diagnosis not present

## 2020-05-26 DIAGNOSIS — E1169 Type 2 diabetes mellitus with other specified complication: Secondary | ICD-10-CM | POA: Diagnosis not present

## 2020-06-07 DIAGNOSIS — E782 Mixed hyperlipidemia: Secondary | ICD-10-CM | POA: Diagnosis not present

## 2020-06-07 DIAGNOSIS — K219 Gastro-esophageal reflux disease without esophagitis: Secondary | ICD-10-CM | POA: Diagnosis not present

## 2020-06-07 DIAGNOSIS — Z23 Encounter for immunization: Secondary | ICD-10-CM | POA: Diagnosis not present

## 2020-06-07 DIAGNOSIS — Z90411 Acquired partial absence of pancreas: Secondary | ICD-10-CM | POA: Diagnosis not present

## 2020-06-07 DIAGNOSIS — F341 Dysthymic disorder: Secondary | ICD-10-CM | POA: Diagnosis not present

## 2020-06-07 DIAGNOSIS — E1169 Type 2 diabetes mellitus with other specified complication: Secondary | ICD-10-CM | POA: Diagnosis not present

## 2020-06-07 DIAGNOSIS — D6869 Other thrombophilia: Secondary | ICD-10-CM | POA: Diagnosis not present

## 2020-06-07 DIAGNOSIS — I10 Essential (primary) hypertension: Secondary | ICD-10-CM | POA: Diagnosis not present

## 2020-06-07 DIAGNOSIS — E559 Vitamin D deficiency, unspecified: Secondary | ICD-10-CM | POA: Diagnosis not present

## 2020-06-09 ENCOUNTER — Telehealth: Payer: Self-pay | Admitting: Cardiology

## 2020-06-09 DIAGNOSIS — M810 Age-related osteoporosis without current pathological fracture: Secondary | ICD-10-CM | POA: Diagnosis not present

## 2020-06-09 DIAGNOSIS — Z905 Acquired absence of kidney: Secondary | ICD-10-CM | POA: Diagnosis not present

## 2020-06-09 DIAGNOSIS — Z7984 Long term (current) use of oral hypoglycemic drugs: Secondary | ICD-10-CM | POA: Diagnosis not present

## 2020-06-09 DIAGNOSIS — Z9889 Other specified postprocedural states: Secondary | ICD-10-CM | POA: Diagnosis not present

## 2020-06-09 DIAGNOSIS — E119 Type 2 diabetes mellitus without complications: Secondary | ICD-10-CM | POA: Diagnosis not present

## 2020-06-09 DIAGNOSIS — E041 Nontoxic single thyroid nodule: Secondary | ICD-10-CM | POA: Diagnosis not present

## 2020-06-09 NOTE — Telephone Encounter (Signed)
Yes

## 2020-06-09 NOTE — Telephone Encounter (Signed)
Jennifer Wall is calling requesting a provider switch from Dr. Bettina Gavia to Dr. Angelena Form due to living in [place] and being familiar with Dr. Angelena Form because her sister is established with him. Please advise.

## 2020-06-09 NOTE — Telephone Encounter (Signed)
OK with me Jennifer Wall  

## 2020-07-25 DIAGNOSIS — F4320 Adjustment disorder, unspecified: Secondary | ICD-10-CM | POA: Diagnosis not present

## 2020-08-02 DIAGNOSIS — F4320 Adjustment disorder, unspecified: Secondary | ICD-10-CM | POA: Diagnosis not present

## 2020-08-09 DIAGNOSIS — F4320 Adjustment disorder, unspecified: Secondary | ICD-10-CM | POA: Diagnosis not present

## 2020-08-21 DIAGNOSIS — F4320 Adjustment disorder, unspecified: Secondary | ICD-10-CM | POA: Diagnosis not present

## 2020-08-31 DIAGNOSIS — F4320 Adjustment disorder, unspecified: Secondary | ICD-10-CM | POA: Diagnosis not present

## 2020-09-27 DIAGNOSIS — E119 Type 2 diabetes mellitus without complications: Secondary | ICD-10-CM | POA: Diagnosis not present

## 2020-09-27 DIAGNOSIS — H52203 Unspecified astigmatism, bilateral: Secondary | ICD-10-CM | POA: Diagnosis not present

## 2020-09-27 DIAGNOSIS — H18513 Endothelial corneal dystrophy, bilateral: Secondary | ICD-10-CM | POA: Diagnosis not present

## 2020-09-27 DIAGNOSIS — H35373 Puckering of macula, bilateral: Secondary | ICD-10-CM | POA: Diagnosis not present

## 2020-10-05 ENCOUNTER — Encounter: Payer: Self-pay | Admitting: Cardiovascular Disease

## 2020-10-05 ENCOUNTER — Ambulatory Visit: Payer: Medicare PPO | Admitting: Cardiovascular Disease

## 2020-10-05 ENCOUNTER — Other Ambulatory Visit: Payer: Self-pay

## 2020-10-05 VITALS — BP 130/70 | HR 48 | Ht 62.0 in | Wt 110.8 lb

## 2020-10-05 DIAGNOSIS — I480 Paroxysmal atrial fibrillation: Secondary | ICD-10-CM | POA: Diagnosis not present

## 2020-10-05 NOTE — Patient Instructions (Signed)

## 2020-10-05 NOTE — Progress Notes (Signed)
Chief Complaint  Patient presents with  . Follow-up    PAF   History of Present Illness:75 yo female with history of DM, HTN, GERD, hiatal hernia, pancreatic mass s/p removal, renal cell carcinoma, HTN, HLD and paroxysmal atrial fibrillation here today for cardiac follow up. She has been followed by Dr. Bettina Gavia but is changing to our [place] office today. 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 She had right nephrectomy. She had atrial fibrillation following her surgery. She has been on Eliquis and Toprol.   She tells me today that she has been feeling well. The patient denies any chest pain, dyspnea, palpitations, lower extremity edema, orthopnea, PND, dizziness, near syncope or syncope.   Primary Care Physician: Lawerance Cruel, MD   Past Medical History:  Diagnosis Date  . Basal cell carcinoma 04/17/2015  . Bilateral renal cysts    incidental finding on CT 06/ 2016  . Diabetes mellitus type 2, noninsulin dependent ([place]) 09/06/2016  . Duplicated ureter, right    per urologist-- dr Karsten Ro  . Essential hypertension 03/23/2015  . GERD (gastroesophageal reflux disease)    mild- controls with diet  . Hiatal hernia   . History of acute pancreatitis    01-14-2006  ([facility])  idiopathic-  resolved  . History of atrial fibrillation without current medication currently 09-18-2015 pt denies S & S   post op 04-14-2015 whipple procedure (per pt had work-up done by [facility] cardiologist- pt was released from cardiology care -- no meds or asa)  . History of basal cell carcinoma excision    multiple skin removal  . History of colon polyps    hyperplastic  . History of malignant neuroendocrine neoplasm followed by oncologist-- dr Manuella Ghazi at [facility]--- per last note 09-19-2016 no recurrence   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  s/p  whipple (T3 N0 M0, Grade 1) negative margins and nodes  . History of melanoma excision    1989 left  thigh  . History of pancreatic surgery    08/ 2016  s/p  whipple for neuroendocrine neoplasm tumor  . 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 Hyperlipidemia   . Hypertension   . Melanoma ([place]) 04/17/2015  . Postoperative atrial fibrillation ([place]) 05/22/2015  . 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 Supraventricular tachycardia ([place])    diagnosed at [facility] by monitor 2017  . Supraventricular tachycardia, paroxysmal ([place]) 06/12/2015  . Type 2 diabetes mellitus ([place])   . 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 Ventral hernia     Past Surgical History:  Procedure Laterality Date  . CATARACT EXTRACTION W/ INTRAOCULAR LENS  IMPLANT, BILATERAL  2010  . COLONOSCOPY  last one 2015  . CYSTOSCOPY WITH RETROGRADE PYELOGRAM, URETEROSCOPY AND STENT PLACEMENT Right 09/22/2015   Procedure: CYSTOSCOPY WITH RIGHT RETROGRADE PYELOGRAM, URETEROSCOPY AND STENT PLACEMENT, BIOPSY;  Surgeon: Kathie Rhodes, MD;  Location: [place];  Service: Urology;  Laterality: Right;  . CYSTOSCOPY WITH RETROGRADE PYELOGRAM, URETEROSCOPY AND STENT PLACEMENT Right 11/13/2015   Procedure: CYSTOSCOPY WITH RIGHT RETROGRADE PYELOGRAM, URETEROSCOPY BIOPSY, FULGURATION  AND STENT ;  Surgeon: Kathie Rhodes, MD;  Location: [place];  Service: Urology;  Laterality: Right;  . CYSTOSCOPY WITH  URETEROSCOPY AND STENT PLACEMENT Right 06/17/2016   Procedure: CYSTOSCOPY , URETEROSCOPY  WITH  FULGUTATION;  Surgeon: Kathie Rhodes, MD;  Location: [facility];  Service: Urology;  Laterality: Right;  . CYSTOSCOPY/RETROGRADE/URETEROSCOPY Right 04/14/2017   Procedure: CYSTOSCOPY/RETROGRADE/URETEROSCOPY/ FULGURATION OF UPPER POLE TUMOR;  Surgeon: Kathie Rhodes, MD;  [facility];  Service: Urology;  Laterality: Right;  . DIAGNOSTIC LAPAROSCOPY  04/14/2015   per office note from [facility] 07/2015  . EUS N/A 02/22/2015   Procedure: UPPER ENDOSCOPIC ULTRASOUND (EUS) RADIAL;  Surgeon: Arta Silence, MD;  [facility]: [facility];  Service: Endoscopy;  Laterality: N/A;  . KIDNEY SURGERY Right    right kindey removed  . LUMBAR DISC SURGERY  2000   L4 - L5  . MELANOMA EXCISION  1989   left thigh  . PANCREATICODUODENECTOMY  04-14-2015   at [facility]   malignant neuroendocrine pancreatic tumor  . PULLEY RELEASE RIGHT THUMB  02-01-2005    Current Outpatient Medications  Medication Sig Dispense Refill  . acetaminophen (TYLENOL) 500 MG tablet Take 1,000 mg by mouth every 6 (six) hours as needed for moderate pain or headache.    . alendronate (FOSAMAX) 70 MG tablet Take 1 mg by mouth once a week.    Marland Kitchen apixaban (ELIQUIS) 5 MG TABS tablet Take 1 tablet (5 mg total) by mouth 2 (two) times daily. 180 tablet 3  . Ascorbic Acid (VITAMIN C) 500 MG CAPS Take 1 capsule by mouth every morning.    . Calcium Carb-Cholecalciferol (CALCIUM 600 + D PO) Take 2 tablets by mouth daily.    . Cholecalciferol (VITAMIN D) 2000 UNITS CAPS Take 1 capsule by mouth 2 (two) times daily.    . citalopram (CELEXA) 20 MG tablet Take 20 mg by mouth every morning.    Marland Kitchen lisinopril (ZESTRIL) 40 MG tablet Take 1 tablet (40 mg total) by mouth every morning. 90 tablet 3  . metoprolol succinate (TOPROL-XL) 50 MG 24 hr tablet Take 50 mg by mouth daily. Take with or immediately following a meal.    . omega-3 acid ethyl esters (LOVAZA) 1 G capsule Take 2 g by mouth 2 (two) times daily.    . pantoprazole (PROTONIX) 40 MG tablet Take 40 mg by mouth every morning.    . simvastatin (ZOCOR) 40 MG tablet Take 40 mg by mouth at bedtime.    Marland Kitchen spironolactone (ALDACTONE) 25 MG tablet Take 25 mg by mouth daily.    Marland Kitchen triamcinolone (NASACORT) 55 MCG/ACT AERO nasal inhaler Place 1 spray into the nose daily as  needed.     No current facility-administered medications for this visit.    Allergies  Allergen Reactions  . Nitrofurantoin Other (See Comments)    diarrhea  . Fenofibrate Micronized Other (See Comments)    Leg pain.   . Metformin Other (See Comments)    Stomach pain, pancreatitis  . Metformin And Related Other (See Comments)    Stomach pain  . Tricor [Fenofibrate] Other (See Comments)    Leg pain.   . Erythromycin Rash    + turn red    Social History   Socioeconomic History  . Marital status: Married    Spouse name: Not on file  . Number of children: Not on file  . Years of education: Not on file  . Highest education level: Not on file  Occupational History  . Not on file  Tobacco Use  . Smoking status: Never Smoker  . Smokeless tobacco: Never Used  Vaping Use  . Vaping Use: Never used  Substance and Sexual Activity  . Alcohol use: Yes    Alcohol/week: 2.0 standard drinks    Types: 2 Glasses of wine per week    Comment: occasional  . Drug use: Yes  . Sexual activity: Yes  Other Topics Concern  . Not on file  Social History Narrative  . Not on file   Social Determinants of Health   Financial Resource Strain: Not on file  Food Insecurity: Not on file  Transportation Needs: Not on file  Physical Activity: Not on file  Stress: Not on file  Social Connections: Not on file  Intimate Partner Violence: Not on file    Family History  Problem Relation Age of Onset  . Cancer Mother   . Hypertension Father   . Breast cancer Sister   . Hypertension Sister     Review of Systems:  As stated in the HPI and otherwise negative.   BP 130/70   Pulse (!) 48   Ht 5\' 2"  (1.575 m)   Wt 110 lb 12.8 oz (50.3 kg)   SpO2 98%   BMI 20.27 kg/m   Physical Examination: General: Well developed, well nourished, NAD  HEENT: OP clear, mucus membranes moist  SKIN: warm, dry. No rashes. Neuro: No focal deficits  Musculoskeletal: Muscle strength 5/5 all ext  Psychiatric:  Mood and affect normal  Neck: No JVD, no carotid bruits, no thyromegaly, no lymphadenopathy.  Lungs:Clear bilaterally, no wheezes, rhonci, crackles Cardiovascular: Regular rate and rhythm. No murmurs, gallops or rubs. Abdomen:Soft. Bowel sounds present. Non-tender.  Extremities: No lower extremity edema. Pulses are 2 + in the bilateral DP/PT.  EKG:  EKG is ordered today. The ekg ordered today demonstrates Sinus bradycardia  Recent Labs: No results found for requested labs within last 8760 hours.   Lipid Panel No results found for: CHOL, TRIG, HDL, CHOLHDL, VLDL, LDLCALC, LDLDIRECT   Wt Readings from Last 3 Encounters:  10/05/20 110 lb 12.8 oz (50.3 kg)  09/30/19 116 lb (52.6 kg)  10/16/18 123 lb 6.4 oz (56 kg)      Assessment and Plan:   1. Paroxysmal atrial fibrillation: Sinus today. No palpitations. Continue Eliquis and Toprol.   Current medicines are reviewed at length with the patient today.  The patient does not have concerns regarding medicines.  The following changes have been made:  no change  Labs/ tests ordered today include:   Orders Placed This Encounter  Procedures  . EKG 12-Lead     Disposition:   F/U with me in one year.    Signed, Lauree Chandler, MD 10/05/2020 11:33 AM    [facility] [address], [place], [place]  [address] Phone: [phone]; Fax: [phone]

## 2020-10-30 ENCOUNTER — Other Ambulatory Visit: Payer: Self-pay | Admitting: Cardiology

## 2020-10-31 DIAGNOSIS — R634 Abnormal weight loss: Secondary | ICD-10-CM | POA: Diagnosis not present

## 2020-10-31 DIAGNOSIS — E1169 Type 2 diabetes mellitus with other specified complication: Secondary | ICD-10-CM | POA: Diagnosis not present

## 2020-10-31 DIAGNOSIS — I480 Paroxysmal atrial fibrillation: Secondary | ICD-10-CM | POA: Diagnosis not present

## 2020-10-31 DIAGNOSIS — R300 Dysuria: Secondary | ICD-10-CM | POA: Diagnosis not present

## 2020-11-01 ENCOUNTER — Other Ambulatory Visit: Payer: Self-pay

## 2020-11-01 MED ORDER — APIXABAN 5 MG PO TABS: 5.0000 mg | ORAL_TABLET | Freq: Two times a day (BID) | ORAL | 1 refills | Status: DC

## 2020-11-01 NOTE — Telephone Encounter (Signed)
77f, 50.3kg, scr 1.0(02/16/20), lovw/mcalhany 10/05/20

## 2020-11-09 ENCOUNTER — Other Ambulatory Visit: Payer: Self-pay | Admitting: *Deleted

## 2020-11-09 MED ORDER — APIXABAN 5 MG PO TABS: 5.0000 mg | ORAL_TABLET | Freq: Two times a day (BID) | ORAL | 1 refills | Status: AC

## 2020-11-09 NOTE — Telephone Encounter (Signed)
Prescription refill request for Eliquis received. Indication: Afib Last office visit: 10/05/2020, Mcalhany Scr: 1.0, 02/16/2020 Age:  75 yo  Weight: 50.3 kg   Pt is on the correct dose of Eliquis per dosing criteria, prescription refill sent to [facility] for Eliquis 5mg  BID.

## 2020-11-20 DIAGNOSIS — N390 Urinary tract infection, site not specified: Secondary | ICD-10-CM | POA: Diagnosis not present

## 2020-11-28 DIAGNOSIS — L570 Actinic keratosis: Secondary | ICD-10-CM | POA: Diagnosis not present

## 2020-11-28 DIAGNOSIS — Z85820 Personal history of malignant melanoma of skin: Secondary | ICD-10-CM | POA: Diagnosis not present

## 2020-11-28 DIAGNOSIS — D225 Melanocytic nevi of trunk: Secondary | ICD-10-CM | POA: Diagnosis not present

## 2020-11-28 DIAGNOSIS — D692 Other nonthrombocytopenic purpura: Secondary | ICD-10-CM | POA: Diagnosis not present

## 2020-11-28 DIAGNOSIS — L814 Other melanin hyperpigmentation: Secondary | ICD-10-CM | POA: Diagnosis not present

## 2020-11-28 DIAGNOSIS — D1801 Hemangioma of skin and subcutaneous tissue: Secondary | ICD-10-CM | POA: Diagnosis not present

## 2020-12-08 DIAGNOSIS — M810 Age-related osteoporosis without current pathological fracture: Secondary | ICD-10-CM | POA: Diagnosis not present

## 2020-12-08 DIAGNOSIS — E041 Nontoxic single thyroid nodule: Secondary | ICD-10-CM | POA: Diagnosis not present

## 2020-12-08 DIAGNOSIS — E119 Type 2 diabetes mellitus without complications: Secondary | ICD-10-CM | POA: Diagnosis not present

## 2021-01-05 DIAGNOSIS — F341 Dysthymic disorder: Secondary | ICD-10-CM | POA: Diagnosis not present

## 2021-01-05 DIAGNOSIS — R634 Abnormal weight loss: Secondary | ICD-10-CM | POA: Diagnosis not present

## 2021-01-05 DIAGNOSIS — R413 Other amnesia: Secondary | ICD-10-CM | POA: Diagnosis not present

## 2021-01-05 DIAGNOSIS — Z09 Encounter for follow-up examination after completed treatment for conditions other than malignant neoplasm: Secondary | ICD-10-CM | POA: Diagnosis not present

## 2021-01-05 DIAGNOSIS — B351 Tinea unguium: Secondary | ICD-10-CM | POA: Diagnosis not present

## 2021-02-14 DIAGNOSIS — C661 Malignant neoplasm of right ureter: Secondary | ICD-10-CM | POA: Diagnosis not present

## 2021-02-14 DIAGNOSIS — Z8554 Personal history of malignant neoplasm of ureter: Secondary | ICD-10-CM | POA: Diagnosis not present

## 2021-02-14 DIAGNOSIS — Z08 Encounter for follow-up examination after completed treatment for malignant neoplasm: Secondary | ICD-10-CM | POA: Diagnosis not present

## 2021-02-14 DIAGNOSIS — C679 Malignant neoplasm of bladder, unspecified: Secondary | ICD-10-CM | POA: Diagnosis not present

## 2021-02-22 DIAGNOSIS — Z800 Family history of malignant neoplasm of digestive organs: Secondary | ICD-10-CM | POA: Diagnosis not present

## 2021-02-22 DIAGNOSIS — D509 Iron deficiency anemia, unspecified: Secondary | ICD-10-CM | POA: Diagnosis not present

## 2021-02-22 DIAGNOSIS — D3A8 Other benign neuroendocrine tumors: Secondary | ICD-10-CM | POA: Diagnosis not present

## 2021-03-26 DIAGNOSIS — R634 Abnormal weight loss: Secondary | ICD-10-CM | POA: Diagnosis not present

## 2021-03-26 DIAGNOSIS — Z86010 Personal history of colonic polyps: Secondary | ICD-10-CM | POA: Diagnosis not present

## 2021-03-26 DIAGNOSIS — I4891 Unspecified atrial fibrillation: Secondary | ICD-10-CM | POA: Diagnosis not present

## 2021-03-26 DIAGNOSIS — Z800 Family history of malignant neoplasm of digestive organs: Secondary | ICD-10-CM | POA: Diagnosis not present

## 2021-03-26 DIAGNOSIS — D509 Iron deficiency anemia, unspecified: Secondary | ICD-10-CM | POA: Diagnosis not present

## 2021-03-27 ENCOUNTER — Telehealth: Payer: Self-pay | Admitting: *Deleted

## 2021-03-27 NOTE — Telephone Encounter (Signed)
   [facility] Pre-operative Risk Assessment    Patient Name: Jennifer Wall  DOB: [birth year] MRN: [id number]  HEARTCARE STAFF:  - IMPORTANT!!!!!! Under Visit Info/Reason for Call, type in Other and utilize the format Clearance MM/DD/YY or Clearance TBD. Do not use dashes or single digits. - Please review there is not already an duplicate clearance open for this procedure. - If request is for dental extraction, please clarify the # of teeth to be extracted. - If the patient is currently at the dentist's office, call Pre-Op Callback Staff (MA/nurse) to input urgent request.  - If the patient is not currently in the dentist office, please route to the Pre-Op pool.  Request for surgical clearance:  What type of surgery is being performed? Colonoscopy/endoscopy  When is this surgery scheduled? 06/14/21  What type of clearance is required (medical clearance vs. Pharmacy clearance to hold med vs. Both)? Both  Are there any medications that need to be held prior to surgery and how long? Eliquis 2 days prior  Practice name and name of physician performing surgery? [facility], Dr Paulita Fujita  What is the office phone number? [phone]   7.   What is the office fax number? [phone]  8.   Anesthesia type (None, local, MAC, general) ? Not listed   Juventino Slovak 03/27/2021, 2:52 PM  _________________________________________________________________   (provider comments below)

## 2021-03-27 NOTE — Telephone Encounter (Signed)
Clinical pharmacist to review Eliquis 

## 2021-03-28 NOTE — Telephone Encounter (Signed)
   Primary Cardiologist: Lauree Chandler, MD  Chart reviewed as part of pre-operative protocol coverage. Given past medical history and time since last visit, based on ACC/AHA guidelines, Jennifer Wall would be at acceptable risk for the planned procedure without further cardiovascular testing.   Patient with diagnosis of atrial fibrillation on Eliquis for anticoagulation.     Procedure: colonoscopy/endoscopy Date of procedure: 06/14/21     CHA2DS2-VASc Score = 5  This indicates a 7.2% annual risk of stroke. The patient's score is based upon: CHF History: No HTN History: Yes Diabetes History: Yes Stroke History: No Vascular Disease History: No Age Score: 2 Gender Score: 1    CrCl 34 Platelet count 157   Per office protocol, patient can hold Eliquis for 2 days prior to procedure.   Patient will not need bridging with Lovenox (enoxaparin) around procedure.   If not bridging, patient should restart Eliquis on the evening of procedure or day after, at discretion of procedure MD  I will route this recommendation to the requesting party via [place] fax function and remove from pre-op pool.  Please call with questions.  Jossie Ng. Ingvald Theisen NP-C    03/28/2021, 9:49 AM [address] [place] [address] Office [phone] Fax [phone]

## 2021-03-28 NOTE — Telephone Encounter (Signed)
Patient with diagnosis of atrial fibrillation on Eliquis for anticoagulation.    Procedure: colonoscopy/endoscopy Date of procedure: 06/14/21   CHA2DS2-VASc Score = 5  This indicates a 7.2% annual risk of stroke. The patient's score is based upon: CHF History: No HTN History: Yes Diabetes History: Yes Stroke History: No Vascular Disease History: No Age Score: 2 Gender Score: 1   CrCl 34 Platelet count 157  Per office protocol, patient can hold Eliquis for 2 days prior to procedure.   Patient will not need bridging with Lovenox (enoxaparin) around procedure.  If not bridging, patient should restart Eliquis on the evening of procedure or day after, at discretion of procedure MD

## 2021-05-10 DIAGNOSIS — Z1231 Encounter for screening mammogram for malignant neoplasm of breast: Secondary | ICD-10-CM | POA: Diagnosis not present

## 2021-06-01 DIAGNOSIS — E041 Nontoxic single thyroid nodule: Secondary | ICD-10-CM | POA: Diagnosis not present

## 2021-06-07 DIAGNOSIS — E1169 Type 2 diabetes mellitus with other specified complication: Secondary | ICD-10-CM | POA: Diagnosis not present

## 2021-06-07 DIAGNOSIS — E559 Vitamin D deficiency, unspecified: Secondary | ICD-10-CM | POA: Diagnosis not present

## 2021-06-07 DIAGNOSIS — E782 Mixed hyperlipidemia: Secondary | ICD-10-CM | POA: Diagnosis not present

## 2021-06-07 DIAGNOSIS — I10 Essential (primary) hypertension: Secondary | ICD-10-CM | POA: Diagnosis not present

## 2021-06-12 DIAGNOSIS — I10 Essential (primary) hypertension: Secondary | ICD-10-CM | POA: Diagnosis not present

## 2021-06-12 DIAGNOSIS — Z0000 Encounter for general adult medical examination without abnormal findings: Secondary | ICD-10-CM | POA: Diagnosis not present

## 2021-06-12 DIAGNOSIS — E559 Vitamin D deficiency, unspecified: Secondary | ICD-10-CM | POA: Diagnosis not present

## 2021-06-12 DIAGNOSIS — E782 Mixed hyperlipidemia: Secondary | ICD-10-CM | POA: Diagnosis not present

## 2021-06-12 DIAGNOSIS — E1169 Type 2 diabetes mellitus with other specified complication: Secondary | ICD-10-CM | POA: Diagnosis not present

## 2021-06-12 DIAGNOSIS — M810 Age-related osteoporosis without current pathological fracture: Secondary | ICD-10-CM | POA: Diagnosis not present

## 2021-06-12 DIAGNOSIS — E875 Hyperkalemia: Secondary | ICD-10-CM | POA: Diagnosis not present

## 2021-06-12 DIAGNOSIS — F341 Dysthymic disorder: Secondary | ICD-10-CM | POA: Diagnosis not present

## 2021-06-12 DIAGNOSIS — D6869 Other thrombophilia: Secondary | ICD-10-CM | POA: Diagnosis not present

## 2021-06-13 DIAGNOSIS — N1831 Chronic kidney disease, stage 3a: Secondary | ICD-10-CM | POA: Diagnosis not present

## 2021-06-13 DIAGNOSIS — E119 Type 2 diabetes mellitus without complications: Secondary | ICD-10-CM | POA: Diagnosis not present

## 2021-06-13 DIAGNOSIS — E041 Nontoxic single thyroid nodule: Secondary | ICD-10-CM | POA: Diagnosis not present

## 2021-06-13 DIAGNOSIS — M810 Age-related osteoporosis without current pathological fracture: Secondary | ICD-10-CM | POA: Diagnosis not present

## 2021-06-14 DIAGNOSIS — D509 Iron deficiency anemia, unspecified: Secondary | ICD-10-CM | POA: Diagnosis not present

## 2021-06-14 DIAGNOSIS — R634 Abnormal weight loss: Secondary | ICD-10-CM | POA: Diagnosis not present

## 2021-06-15 ENCOUNTER — Other Ambulatory Visit: Payer: Self-pay | Admitting: Physician Assistant

## 2021-06-15 DIAGNOSIS — D649 Anemia, unspecified: Secondary | ICD-10-CM

## 2021-06-15 DIAGNOSIS — Z85528 Personal history of other malignant neoplasm of kidney: Secondary | ICD-10-CM

## 2021-06-15 DIAGNOSIS — R634 Abnormal weight loss: Secondary | ICD-10-CM

## 2021-07-02 ENCOUNTER — Ambulatory Visit
Admission: RE | Admit: 2021-07-02 | Discharge: 2021-07-02 | Disposition: A | Discharge status: Home / Self Care | Payer: Medicare PPO | Source: Ambulatory Visit | Attending: Physician Assistant | Admitting: Physician Assistant

## 2021-07-02 ENCOUNTER — Other Ambulatory Visit: Payer: Self-pay

## 2021-07-02 DIAGNOSIS — K2970 Gastritis, unspecified, without bleeding: Secondary | ICD-10-CM | POA: Diagnosis not present

## 2021-07-02 DIAGNOSIS — R634 Abnormal weight loss: Secondary | ICD-10-CM | POA: Diagnosis not present

## 2021-07-02 DIAGNOSIS — Z85528 Personal history of other malignant neoplasm of kidney: Secondary | ICD-10-CM

## 2021-07-02 DIAGNOSIS — K8689 Other specified diseases of pancreas: Secondary | ICD-10-CM | POA: Diagnosis not present

## 2021-07-02 DIAGNOSIS — D649 Anemia, unspecified: Secondary | ICD-10-CM

## 2021-07-02 DIAGNOSIS — K5900 Constipation, unspecified: Secondary | ICD-10-CM | POA: Diagnosis not present

## 2021-07-02 MED ORDER — IOPAMIDOL (ISOVUE-370) INJECTION 76%
100.0000 mL | Freq: Once | INTRAVENOUS | Status: AC | PRN
  Administered 2021-07-02: 100 mL via INTRAVENOUS

## 2021-07-16 DIAGNOSIS — D3A8 Other benign neuroendocrine tumors: Secondary | ICD-10-CM | POA: Diagnosis not present

## 2021-07-16 DIAGNOSIS — K869 Disease of pancreas, unspecified: Secondary | ICD-10-CM | POA: Diagnosis not present

## 2021-07-16 DIAGNOSIS — K5900 Constipation, unspecified: Secondary | ICD-10-CM | POA: Diagnosis not present

## 2021-07-16 DIAGNOSIS — K8689 Other specified diseases of pancreas: Secondary | ICD-10-CM | POA: Diagnosis not present

## 2021-07-16 DIAGNOSIS — D649 Anemia, unspecified: Secondary | ICD-10-CM | POA: Diagnosis not present

## 2021-07-16 DIAGNOSIS — R634 Abnormal weight loss: Secondary | ICD-10-CM | POA: Diagnosis not present

## 2021-07-16 DIAGNOSIS — Z1211 Encounter for screening for malignant neoplasm of colon: Secondary | ICD-10-CM | POA: Diagnosis not present

## 2021-07-16 DIAGNOSIS — Z90411 Acquired partial absence of pancreas: Secondary | ICD-10-CM | POA: Diagnosis not present

## 2021-07-17 ENCOUNTER — Other Ambulatory Visit: Payer: Self-pay | Admitting: Physician Assistant

## 2021-07-17 DIAGNOSIS — K869 Disease of pancreas, unspecified: Secondary | ICD-10-CM

## 2021-07-17 DIAGNOSIS — D3A8 Other benign neuroendocrine tumors: Secondary | ICD-10-CM

## 2021-07-20 DIAGNOSIS — E875 Hyperkalemia: Secondary | ICD-10-CM | POA: Diagnosis not present

## 2021-07-24 DIAGNOSIS — D649 Anemia, unspecified: Secondary | ICD-10-CM | POA: Diagnosis not present

## 2021-07-27 DIAGNOSIS — Z1212 Encounter for screening for malignant neoplasm of rectum: Secondary | ICD-10-CM | POA: Diagnosis not present

## 2021-07-27 DIAGNOSIS — Z1211 Encounter for screening for malignant neoplasm of colon: Secondary | ICD-10-CM | POA: Diagnosis not present

## 2021-07-28 DIAGNOSIS — L03213 Periorbital cellulitis: Secondary | ICD-10-CM | POA: Diagnosis not present

## 2021-07-28 DIAGNOSIS — J069 Acute upper respiratory infection, unspecified: Secondary | ICD-10-CM | POA: Diagnosis not present

## 2021-08-03 LAB — COLOGUARD: COLOGUARD: NEGATIVE

## 2021-08-04 ENCOUNTER — Other Ambulatory Visit: Payer: Self-pay

## 2021-08-04 ENCOUNTER — Ambulatory Visit
Admission: RE | Admit: 2021-08-04 | Discharge: 2021-08-04 | Disposition: A | Discharge status: Home / Self Care | Payer: Medicare PPO | Source: Ambulatory Visit | Attending: Physician Assistant | Admitting: Physician Assistant

## 2021-08-04 DIAGNOSIS — R634 Abnormal weight loss: Secondary | ICD-10-CM | POA: Diagnosis not present

## 2021-08-04 DIAGNOSIS — K869 Disease of pancreas, unspecified: Secondary | ICD-10-CM

## 2021-08-04 DIAGNOSIS — K862 Cyst of pancreas: Secondary | ICD-10-CM | POA: Diagnosis not present

## 2021-08-04 DIAGNOSIS — D3A8 Other benign neuroendocrine tumors: Secondary | ICD-10-CM

## 2021-08-04 DIAGNOSIS — N281 Cyst of kidney, acquired: Secondary | ICD-10-CM | POA: Diagnosis not present

## 2021-08-04 MED ORDER — GADOBENATE DIMEGLUMINE 529 MG/ML IV SOLN
8.0000 mL | Freq: Once | INTRAVENOUS | Status: AC | PRN
  Administered 2021-08-04: 8 mL via INTRAVENOUS

## 2021-08-15 DIAGNOSIS — E538 Deficiency of other specified B group vitamins: Secondary | ICD-10-CM | POA: Diagnosis not present

## 2021-08-15 DIAGNOSIS — E782 Mixed hyperlipidemia: Secondary | ICD-10-CM | POA: Diagnosis not present

## 2021-09-28 DIAGNOSIS — H18513 Endothelial corneal dystrophy, bilateral: Secondary | ICD-10-CM | POA: Diagnosis not present

## 2021-09-28 DIAGNOSIS — Z961 Presence of intraocular lens: Secondary | ICD-10-CM | POA: Diagnosis not present

## 2021-09-28 DIAGNOSIS — H52203 Unspecified astigmatism, bilateral: Secondary | ICD-10-CM | POA: Diagnosis not present

## 2021-10-17 DIAGNOSIS — D3A8 Other benign neuroendocrine tumors: Secondary | ICD-10-CM | POA: Diagnosis not present

## 2021-10-17 DIAGNOSIS — Z800 Family history of malignant neoplasm of digestive organs: Secondary | ICD-10-CM | POA: Diagnosis not present

## 2021-10-17 DIAGNOSIS — K8689 Other specified diseases of pancreas: Secondary | ICD-10-CM | POA: Diagnosis not present

## 2021-10-17 DIAGNOSIS — R634 Abnormal weight loss: Secondary | ICD-10-CM | POA: Diagnosis not present

## 2021-11-05 NOTE — Progress Notes (Signed)
? ? ?Chief Complaint  ?Patient presents with  ? Follow-up  ?  Atrial fibrillation  ? ?History of Present Illness:76 yo female with history of DM, HTN, GERD, hiatal hernia, pancreatic mass s/p removal, renal cell carcinoma, HTN, HLD and paroxysmal atrial fibrillation here today for cardiac follow up. She has been followed by Dr. Bettina Gavia but changed to our [facility] office in February 2022. 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 She had right nephrectomy. She had atrial fibrillation following her surgery. She has been on Eliquis and Toprol.  ? ?She is here today for follow up. The patient denies any chest pain, dyspnea, palpitations, lower extremity edema, orthopnea, PND, dizziness, near syncope or syncope. 10 lb weight loss. She has been seeing primary care for workup but no findings to explain this.  ? ?Primary Care Physician: Lawerance Cruel, MD ? ? ?Past Medical History:  ?Diagnosis Date  ? Basal cell carcinoma 04/17/2015  ? Bilateral renal cysts   ? incidental finding on CT 06/ 2016  ? Diabetes mellitus type 2, noninsulin dependent ([place]) 09/06/2016  ? Duplicated ureter, right   ? per urologist-- dr Karsten Ro  ? Essential hypertension 03/23/2015  ? GERD (gastroesophageal reflux disease)   ? mild- controls with diet  ? Hiatal hernia   ? History of acute pancreatitis   ? 01-14-2006  ([facility])  idiopathic-  resolved  ? History of atrial fibrillation without current medication currently 09-18-2015 pt denies S & S  ? post op 04-14-2015 whipple procedure (per pt had work-up done by [facility] cardiologist- pt was released from cardiology care -- no meds or asa)  ? History of basal cell carcinoma excision   ? multiple skin removal  ? History of colon polyps   ? hyperplastic  ? History of malignant neuroendocrine neoplasm followed by oncologist-- dr Manuella Ghazi at [facility]--- per last note 09-19-2016 no recurrence  ? 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  s/p  whipple (T3 N0  M0, Grade 1) negative margins and nodes  ? History of melanoma excision   ? 1989 left thigh  ? History of pancreatic surgery   ? 08/ 2016  s/p  whipple for neuroendocrine neoplasm tumor  ? History of renal cell cancer UROLOGIST-  DR OTTELIN  ? 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 Hyperlipidemia   ? Hypertension   ? Melanoma ([place]) 04/17/2015  ? Postoperative atrial fibrillation ([place]) 05/22/2015  ? 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 Supraventricular tachycardia ([place])   ? diagnosed at [facility] by monitor 2017  ? Supraventricular tachycardia, paroxysmal ([place]) 06/12/2015  ? Type 2 diabetes mellitus ([place])   ? 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 Ventral hernia   ? ? ?Past Surgical History:  ?Procedure Laterality Date  ? CATARACT EXTRACTION W/ INTRAOCULAR LENS  IMPLANT, BILATERAL  2010  ? COLONOSCOPY  last one 2015  ? CYSTOSCOPY WITH RETROGRADE PYELOGRAM, URETEROSCOPY AND STENT PLACEMENT Right 09/22/2015  ? Procedure: CYSTOSCOPY WITH RIGHT RETROGRADE PYELOGRAM, URETEROSCOPY AND STENT PLACEMENT, BIOPSY;  Surgeon: Kathie Rhodes, MD;  Location: [place];  Service: Urology;  Laterality: Right;  ? CYSTOSCOPY WITH RETROGRADE PYELOGRAM, URETEROSCOPY AND STENT PLACEMENT Right 11/13/2015  ? Procedure: CYSTOSCOPY WITH RIGHT RETROGRADE PYELOGRAM, URETEROSCOPY BIOPSY, FULGURATION  AND STENT ;  Surgeon: Kathie Rhodes,  MD;  Location: [place];  Service: Urology;  Laterality: Right;  ? CYSTOSCOPY WITH URETEROSCOPY AND STENT PLACEMENT Right 06/17/2016  ? Procedure: CYSTOSCOPY , URETEROSCOPY  WITH  FULGUTATION;  Surgeon: Kathie Rhodes, MD;  Location: [facility];  Service: Urology;  Laterality: Right;  ? CYSTOSCOPY/RETROGRADE/URETEROSCOPY Right 04/14/2017  ? Procedure: CYSTOSCOPY/RETROGRADE/URETEROSCOPY/  FULGURATION OF UPPER POLE TUMOR;  Surgeon: Kathie Rhodes, MD;  Location: [facility];  Service: Urology;  Laterality: Right;  ? DIAGNOSTIC LAPAROSCOPY  04/14/2015  ? per office note from [facility] 07/2015  ? EUS N/A 02/22/2015  ? Procedure: UPPER ENDOSCOPIC ULTRASOUND (EUS) RADIAL;  Surgeon: Arta Silence, MD;  Location: [facility];  Service: Endoscopy;  Laterality: N/A;  ? KIDNEY SURGERY Right   ? right kindey removed  ? [place] SURGERY  2000  ? L4 - L5  ? MELANOMA EXCISION  1989  ? left thigh  ? PANCREATICODUODENECTOMY  04-14-2015   at [place]  ? malignant neuroendocrine pancreatic tumor  ? PULLEY RELEASE RIGHT THUMB  02-01-2005  ? ? ?Current Outpatient Medications  ?Medication Sig Dispense Refill  ? acetaminophen (TYLENOL) 500 MG tablet Take 1,000 mg by mouth every 6 (six) hours as needed for moderate pain or headache.    ? alendronate (FOSAMAX) 70 MG tablet Take 1 mg by mouth once a week.    ? apixaban (ELIQUIS) 5 MG TABS tablet Take 1 tablet (5 mg total) by mouth 2 (two) times daily. 180 tablet 1  ? Ascorbic Acid (VITAMIN C) 500 MG CAPS Take 1 capsule by mouth every morning.    ? Cholecalciferol (VITAMIN D) 2000 UNITS CAPS Take 1 capsule by mouth 2 (two) times daily.    ? citalopram (CELEXA) 20 MG tablet Take 20 mg by mouth every morning.    ? lisinopril (ZESTRIL) 10 MG tablet Take 10 mg by mouth daily.    ? metoprolol succinate (TOPROL-XL) 50 MG 24 hr tablet Take 50 mg by mouth daily. Take with or immediately following a meal.    ? omega-3 acid ethyl esters (LOVAZA) 1 G capsule Take 2 g by mouth 2 (two) times daily.    ? simvastatin (ZOCOR) 40 MG tablet Take 40 mg by mouth at bedtime.    ? spironolactone (ALDACTONE) 25 MG tablet Take 25 mg by mouth daily.    ? triamcinolone (NASACORT) 55 MCG/ACT AERO nasal inhaler Place 1 spray into the nose daily as needed.    ? ?No current facility-administered medications for this visit.  ? ? ?Allergies  ?Allergen Reactions  ? Nitrofurantoin Other (See Comments)   ?  diarrhea  ? Fenofibrate Micronized Other (See Comments)  ?  Leg pain.   ? Metformin Other (See Comments)  ?  Stomach pain, pancreatitis  ? Metformin And Related Other (See Comments)  ?  Stomach pain  ? Tricor [Fenofibrate] Other (See Comments)  ?  Leg pain.   ? Erythromycin Rash  ?  + turn red  ? ? ?Social History  ? ?Socioeconomic History  ? Marital status: Married  ?  Spouse name: Not on file  ? Number of children: Not on file  ? Years of education: Not on file  ? Highest education level: Not on file  ?Occupational History  ? Not on file  ?Tobacco Use  ? Smoking status: Never  ? Smokeless tobacco: Never  ?Vaping Use  ? Vaping Use: Never used  ?Substance and Sexual Activity  ? Alcohol use: Yes  ?  Alcohol/week: 2.0  standard drinks  ?  Types: 2 Glasses of wine per week  ?  Comment: occasional  ? Drug use: Yes  ? Sexual activity: Yes  ?Other Topics Concern  ? Not on file  ?Social History Narrative  ? Not on file  ? ?Social Determinants of Health  ? ?Financial Resource Strain: Not on file  ?Food Insecurity: Not on file  ?Transportation Needs: Not on file  ?Physical Activity: Not on file  ?Stress: Not on file  ?Social Connections: Not on file  ?Intimate Partner Violence: Not on file  ? ? ?Family History  ?Problem Relation Age of Onset  ? Cancer Mother   ? Hypertension Father   ? Breast cancer Sister   ? Hypertension Sister   ? ? ?Review of Systems:  As stated in the HPI and otherwise negative.  ? ?BP 108/70   Pulse (!) 58   Ht '5\' 2"'$  (1.575 m)   Wt 101 lb 12.8 oz (46.2 kg)   SpO2 98%   BMI 18.62 kg/m?  ? ?Physical Examination: ?General: Well developed, well nourished, NAD  ?HEENT: OP clear, mucus membranes moist  ?SKIN: warm, dry. No rashes. ?Neuro: No focal deficits  ?Musculoskeletal: Muscle strength 5/5 all ext  ?Psychiatric: Mood and affect normal  ?Neck: No JVD, no carotid bruits, no thyromegaly, no lymphadenopathy.  ?Lungs:Clear bilaterally, no wheezes, rhonci, crackles ?Cardiovascular: Regular rate and  rhythm. No murmurs, gallops or rubs. ?Abdomen:Soft. Bowel sounds present. Non-tender.  ?Extremities: No lower extremity edema. Pulses are 2 + in the bilateral DP/PT. ? ?EKG:  EKG is ordered today. ?The ekg ordered t

## 2021-11-06 ENCOUNTER — Other Ambulatory Visit: Payer: Self-pay

## 2021-11-06 ENCOUNTER — Ambulatory Visit: Payer: Medicare PPO | Admitting: Cardiovascular Disease

## 2021-11-06 ENCOUNTER — Encounter: Payer: Self-pay | Admitting: Cardiovascular Disease

## 2021-11-06 VITALS — BP 108/70 | HR 58 | Ht 62.0 in | Wt 101.8 lb

## 2021-11-06 DIAGNOSIS — I480 Paroxysmal atrial fibrillation: Secondary | ICD-10-CM

## 2021-11-06 NOTE — Patient Instructions (Signed)
Medication Instructions:  ?No changes ?*If you need a refill on your cardiac medications before your next appointment, please call your pharmacy* ? ? ?Lab Work: ?none ?If you have labs (blood work) drawn today and your tests are completely normal, you will receive your results only by: ?MyChart Message (if you have MyChart) OR ?A paper copy in the mail ?If you have any lab test that is abnormal or we need to change your treatment, we will call you to review the results. ? ? ?Testing/Procedures: ?Your physician has requested that you have an echocardiogram. Echocardiography is a painless test that uses sound waves to create images of your heart. It provides your doctor with information about the size and shape of your heart and how well your heart?s chambers and valves are working. This procedure takes approximately one hour. There are no restrictions for this procedure. ? ? ?Follow-Up: ?At [facility], you and your health needs are our priority.  As part of our continuing mission to provide you with exceptional heart care, we have created designated Provider Care Teams.  These Care Teams include your primary Cardiologist (physician) and Advanced Practice Providers (APPs -  Physician Assistants and Nurse Practitioners) who all work together to provide you with the care you need, when you need it. ? ? ?Your next appointment:   ?12 month(s) ? ?The format for your next appointment:   ?In Person ? ?Provider:   ?Lauree Chandler, MD   ? ?  ?

## 2021-11-28 ENCOUNTER — Ambulatory Visit (HOSPITAL_COMMUNITY): Payer: Medicare PPO | Attending: Cardiovascular Disease

## 2021-11-28 DIAGNOSIS — I480 Paroxysmal atrial fibrillation: Secondary | ICD-10-CM | POA: Diagnosis not present

## 2021-11-28 LAB — ECHOCARDIOGRAM COMPLETE
Area-P 1/2: 3.27 cm2
S' Lateral: 2.4 cm

## 2021-12-11 DIAGNOSIS — C44622 Squamous cell carcinoma of skin of right upper limb, including shoulder: Secondary | ICD-10-CM | POA: Diagnosis not present

## 2021-12-11 DIAGNOSIS — L814 Other melanin hyperpigmentation: Secondary | ICD-10-CM | POA: Diagnosis not present

## 2021-12-11 DIAGNOSIS — L821 Other seborrheic keratosis: Secondary | ICD-10-CM | POA: Diagnosis not present

## 2021-12-11 DIAGNOSIS — D225 Melanocytic nevi of trunk: Secondary | ICD-10-CM | POA: Diagnosis not present

## 2021-12-11 DIAGNOSIS — D0461 Carcinoma in situ of skin of right upper limb, including shoulder: Secondary | ICD-10-CM | POA: Diagnosis not present

## 2021-12-11 DIAGNOSIS — D1801 Hemangioma of skin and subcutaneous tissue: Secondary | ICD-10-CM | POA: Diagnosis not present

## 2021-12-11 DIAGNOSIS — D485 Neoplasm of uncertain behavior of skin: Secondary | ICD-10-CM | POA: Diagnosis not present

## 2021-12-11 DIAGNOSIS — L570 Actinic keratosis: Secondary | ICD-10-CM | POA: Diagnosis not present

## 2021-12-11 DIAGNOSIS — Z85820 Personal history of malignant melanoma of skin: Secondary | ICD-10-CM | POA: Diagnosis not present

## 2021-12-11 DIAGNOSIS — D692 Other nonthrombocytopenic purpura: Secondary | ICD-10-CM | POA: Diagnosis not present

## 2022-02-02 DIAGNOSIS — R300 Dysuria: Secondary | ICD-10-CM | POA: Diagnosis not present

## 2022-02-02 DIAGNOSIS — E1165 Type 2 diabetes mellitus with hyperglycemia: Secondary | ICD-10-CM | POA: Diagnosis not present

## 2022-02-02 DIAGNOSIS — N3001 Acute cystitis with hematuria: Secondary | ICD-10-CM | POA: Diagnosis not present

## 2022-02-02 DIAGNOSIS — R350 Frequency of micturition: Secondary | ICD-10-CM | POA: Diagnosis not present

## 2022-02-14 DIAGNOSIS — E041 Nontoxic single thyroid nodule: Secondary | ICD-10-CM | POA: Diagnosis not present

## 2022-02-14 DIAGNOSIS — M810 Age-related osteoporosis without current pathological fracture: Secondary | ICD-10-CM | POA: Diagnosis not present

## 2022-02-14 DIAGNOSIS — N1831 Chronic kidney disease, stage 3a: Secondary | ICD-10-CM | POA: Diagnosis not present

## 2022-02-14 DIAGNOSIS — E1165 Type 2 diabetes mellitus with hyperglycemia: Secondary | ICD-10-CM | POA: Diagnosis not present

## 2022-02-14 DIAGNOSIS — N390 Urinary tract infection, site not specified: Secondary | ICD-10-CM | POA: Diagnosis not present

## 2022-02-15 DIAGNOSIS — C661 Malignant neoplasm of right ureter: Secondary | ICD-10-CM | POA: Diagnosis not present

## 2022-02-21 DIAGNOSIS — K8681 Exocrine pancreatic insufficiency: Secondary | ICD-10-CM | POA: Diagnosis not present

## 2022-02-21 DIAGNOSIS — D3A8 Other benign neuroendocrine tumors: Secondary | ICD-10-CM | POA: Diagnosis not present

## 2022-02-27 DIAGNOSIS — C661 Malignant neoplasm of right ureter: Secondary | ICD-10-CM | POA: Diagnosis not present

## 2022-03-08 DIAGNOSIS — N390 Urinary tract infection, site not specified: Secondary | ICD-10-CM | POA: Diagnosis not present

## 2022-04-08 DIAGNOSIS — L570 Actinic keratosis: Secondary | ICD-10-CM | POA: Diagnosis not present

## 2022-04-08 DIAGNOSIS — Z85828 Personal history of other malignant neoplasm of skin: Secondary | ICD-10-CM | POA: Diagnosis not present

## 2022-04-08 DIAGNOSIS — Z85820 Personal history of malignant melanoma of skin: Secondary | ICD-10-CM | POA: Diagnosis not present

## 2022-05-20 DIAGNOSIS — Z1231 Encounter for screening mammogram for malignant neoplasm of breast: Secondary | ICD-10-CM | POA: Diagnosis not present

## 2022-06-14 DIAGNOSIS — E041 Nontoxic single thyroid nodule: Secondary | ICD-10-CM | POA: Diagnosis not present

## 2022-06-21 DIAGNOSIS — M549 Dorsalgia, unspecified: Secondary | ICD-10-CM | POA: Diagnosis not present

## 2022-06-21 DIAGNOSIS — M810 Age-related osteoporosis without current pathological fracture: Secondary | ICD-10-CM | POA: Diagnosis not present

## 2022-06-21 DIAGNOSIS — D509 Iron deficiency anemia, unspecified: Secondary | ICD-10-CM | POA: Diagnosis not present

## 2022-06-21 DIAGNOSIS — I4891 Unspecified atrial fibrillation: Secondary | ICD-10-CM | POA: Diagnosis not present

## 2022-06-21 DIAGNOSIS — I10 Essential (primary) hypertension: Secondary | ICD-10-CM | POA: Diagnosis not present

## 2022-06-21 DIAGNOSIS — E1169 Type 2 diabetes mellitus with other specified complication: Secondary | ICD-10-CM | POA: Diagnosis not present

## 2022-06-21 DIAGNOSIS — F341 Dysthymic disorder: Secondary | ICD-10-CM | POA: Diagnosis not present

## 2022-06-21 DIAGNOSIS — D6869 Other thrombophilia: Secondary | ICD-10-CM | POA: Diagnosis not present

## 2022-06-21 DIAGNOSIS — Z0000 Encounter for general adult medical examination without abnormal findings: Secondary | ICD-10-CM | POA: Diagnosis not present

## 2022-06-21 DIAGNOSIS — E782 Mixed hyperlipidemia: Secondary | ICD-10-CM | POA: Diagnosis not present

## 2022-06-28 DIAGNOSIS — R350 Frequency of micturition: Secondary | ICD-10-CM | POA: Diagnosis not present

## 2022-06-28 DIAGNOSIS — E119 Type 2 diabetes mellitus without complications: Secondary | ICD-10-CM | POA: Diagnosis not present

## 2022-06-28 DIAGNOSIS — N390 Urinary tract infection, site not specified: Secondary | ICD-10-CM | POA: Diagnosis not present

## 2022-06-28 DIAGNOSIS — I10 Essential (primary) hypertension: Secondary | ICD-10-CM | POA: Diagnosis not present

## 2022-10-15 DIAGNOSIS — H52203 Unspecified astigmatism, bilateral: Secondary | ICD-10-CM | POA: Diagnosis not present

## 2022-10-15 DIAGNOSIS — E119 Type 2 diabetes mellitus without complications: Secondary | ICD-10-CM | POA: Diagnosis not present

## 2022-10-15 DIAGNOSIS — H18513 Endothelial corneal dystrophy, bilateral: Secondary | ICD-10-CM | POA: Diagnosis not present

## 2023-01-07 ENCOUNTER — Ambulatory Visit: Payer: Medicare PPO | Attending: Physician Assistant | Admitting: Cardiology

## 2023-01-07 ENCOUNTER — Encounter: Payer: Self-pay | Admitting: Cardiology

## 2023-01-07 VITALS — BP 130/80 | HR 47 | Ht 62.0 in | Wt 106.8 lb

## 2023-01-07 DIAGNOSIS — E119 Type 2 diabetes mellitus without complications: Secondary | ICD-10-CM

## 2023-01-07 DIAGNOSIS — E1169 Type 2 diabetes mellitus with other specified complication: Secondary | ICD-10-CM

## 2023-01-07 DIAGNOSIS — E785 Hyperlipidemia, unspecified: Secondary | ICD-10-CM

## 2023-01-07 DIAGNOSIS — D6869 Other thrombophilia: Secondary | ICD-10-CM | POA: Insufficient documentation

## 2023-01-07 DIAGNOSIS — I480 Paroxysmal atrial fibrillation: Secondary | ICD-10-CM | POA: Diagnosis not present

## 2023-01-07 DIAGNOSIS — I10 Essential (primary) hypertension: Secondary | ICD-10-CM

## 2023-01-07 MED ORDER — METOPROLOL SUCCINATE ER 50 MG PO TB24: 25.0000 mg | ORAL_TABLET | Freq: Every day | ORAL | 3 refills | Status: AC

## 2023-01-07 NOTE — Patient Instructions (Addendum)
Medication Instructions:  Your physician has recommended you make the following change in your medication:   REDUCE the Metoprolol to 50 mg taking only 1/2 tablet daily  *If you need a refill on your cardiac medications before your next appointment, please call your pharmacy*   Lab Work: TODAY:  BMET  If you have labs (blood work) drawn today and your tests are completely normal, you will receive your results only by: MyChart Message (if you have MyChart) OR A paper copy in the mail If you have any lab test that is abnormal or we need to change your treatment, we will call you to review the results.   Testing/Procedures: None ordered   Follow-Up: At [facility], you and your health needs are our priority.  As part of our continuing mission to provide you with exceptional heart care, we have created designated Provider Care Teams.  These Care Teams include your primary Cardiologist (physician) and Advanced Practice Providers (APPs -  Physician Assistants and Nurse Practitioners) who all work together to provide you with the care you need, when you need it.  We recommend signing up for the patient portal called "MyChart".  Sign up information is provided on this After Visit Summary.  MyChart is used to connect with patients for Virtual Visits (Telemedicine).  Patients are able to view lab/test results, encounter notes, upcoming appointments, etc.  Non-urgent messages can be sent to your provider as well.   To learn more about what you can do with MyChart, go to [email or web address].    Your next appointment:   12 month(s)  Provider:   Verne Carrow, MD     Other Instructions Your physician has requested that you regularly monitor and record your blood pressure readings at home. Please use the same machine at the same time of day to check your readings and record them to bring to your follow-up visit.   Please monitor blood pressures and keep a log of your readings  for 1 week then send a message via mychart.    Make sure to check 2 hours after your medications.    AVOID these things for 30 minutes before checking your blood pressure: No Drinking caffeine. No Drinking alcohol. No Eating. No Smoking. No Exercising.   Five minutes before checking your blood pressure: Pee. Sit in a dining chair. Avoid sitting in a soft couch or armchair. Be quiet. Do not talk

## 2023-01-07 NOTE — Assessment & Plan Note (Signed)
Last A1c 6.9 as of 06/21/22. Patient previously followed by Dr. Sharl Ma but has more recently seen PCP for DM care. Encouraged regular follow up to monitor glucose/A1c.

## 2023-01-07 NOTE — Progress Notes (Signed)
Cardiology Office Note:    Date:  01/07/2023   ID:  Jennifer Wall, DOB 01-03-1946, MRN [id number]  PCP:  Daisy Floro, MD  [facility] Providers Cardiologist:  Verne Carrow, MD    Referring MD: Daisy Floro, MD   Chief Complaint:  No chief complaint on file.    Patient Profile:  Paroxysmal atrial fibrillation Onset following pancreatic surgery. 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 Exam Location:  [address]  Procedure: 2D Echo, 3D Echo, Cardiac Doppler and Color Doppler  Indications:     I48.0 Atrial Fibrillation  History:         Patient has no prior history of Echocardiogram examinations. Arrythmias:Atrial Fibrillation and SVT; Risk Factors:Hypertension, Diabetes and Dyslipidemia. Renal Cell Carcinoma status post Nephrectomy, Rt. Uretal Carcinoma.  Sonographer:     Farrel Conners RDCS Referring Phys:  Kathleene Hazel Diagnosing Phys: Thurmon Fair MD  IMPRESSIONS   1. Left ventricular ejection fraction, by estimation, is 60 to 65%. The left ventricle has normal function. The left ventricle has no regional wall motion abnormalities. Left ventricular diastolic parameters were normal. 2. Right ventricular systolic function is normal. The right ventricular size is normal. There is normal pulmonary artery systolic pressure. The  estimated right ventricular systolic pressure is 25.5 mmHg. 3. Left atrial size was mildly dilated. 4. The mitral valve is normal in structure. Trivial mitral valve regurgitation. No evidence of mitral stenosis. 5. The aortic valve is tricuspid. There is mild thickening of the aortic valve. Aortic valve regurgitation is not visualized. Aortic valve sclerosis is present, with no evidence of aortic valve stenosis. 6. The inferior vena cava is normal in size with greater than 50% respiratory variability, suggesting right atrial pressure of 3 mmHg.  FINDINGS Left Ventricle: Left ventricular ejection fraction, by estimation, is 60 to 65%. The left ventricle has normal function. The left ventricle has no regional wall motion abnormalities. 3D left ventricular ejection fraction analysis performed but not reported based on interpreter judgement due to suboptimal tracking. The left ventricular internal cavity size was normal in size. There is no left ventricular hypertrophy. Left ventricular diastolic parameters were normal. Normal left ventricular filling pressure.  Right Ventricle: The right ventricular size is normal. No increase in right ventricular wall thickness. Right ventricular systolic function is normal. There is normal pulmonary artery systolic pressure. The tricuspid regurgitant velocity is 2.37 m/s, and with an assumed right atrial pressure of 3 mmHg, the estimated right ventricular systolic pressure is 25.5 mmHg.  Left Atrium: Left atrial size  was mildly dilated.  Right Atrium: Right atrial size was normal in size.  Pericardium: There is no evidence of pericardial effusion.  Mitral Valve: The mitral valve is normal in structure. Mild mitral annular calcification. Trivial mitral valve regurgitation. No evidence of mitral valve stenosis.  Tricuspid Valve: The tricuspid valve is normal in structure. Tricuspid valve regurgitation is mild . No evidence of tricuspid stenosis.  Aortic Valve:  The aortic valve is tricuspid. There is mild thickening of the aortic valve. Aortic valve regurgitation is not visualized. Aortic valve sclerosis is present, with no evidence of aortic valve stenosis.  Pulmonic Valve: The pulmonic valve was normal in structure. Pulmonic valve regurgitation is trivial. No evidence of pulmonic stenosis.  Aorta: The aortic root is normal in size and structure.  Venous: The inferior vena cava is normal in size with greater than 50% respiratory variability, suggesting right atrial pressure of 3 mmHg.  IAS/Shunts: No atrial level shunt detected by color flow Doppler.   LEFT VENTRICLE PLAX 2D LVIDd:         4.10 cm   Diastology LVIDs:         2.40 cm   LV e' medial:    8.81 cm/s LV PW:         0.70 cm   LV E/e' medial:  9.5 LV IVS:        0.80 cm   LV e' lateral:   13.10 cm/s LVOT diam:     2.05 cm   LV E/e' lateral: 6.4 LV SV:         78 LV SV Index:   54 LVOT Area:     3.30 cm  3D Volume EF: 3D EF:        75 % LV EDV:       112 ml LV ESV:       28 ml LV SV:        84 ml  RIGHT VENTRICLE RV Basal diam:  4.00 cm RV Mid diam:    3.20 cm  LEFT ATRIUM           Index        RIGHT ATRIUM           Index LA diam:      3.00 cm 2.09 cm/m   RA Area:     19.10 cm LA Vol (A2C): 42.2 ml 29.41 ml/m  RA Volume:   59.50 ml  41.47 ml/m LA Vol (A4C): 51.9 ml 36.21 ml/m AORTIC VALVE LVOT Vmax:   90.87 cm/s LVOT Vmean:  55.033 cm/s LVOT VTI:    0.235 m  AORTA Ao Root diam: 3.00 cm Ao Asc diam:  2.95 cm  MITRAL VALVE               TRICUSPID VALVE MV Area (PHT): cm         TR Peak grad:   22.5 mmHg MV Decel Time: 232 msec    TR Vmax:        237.00 cm/s MV E velocity: 83.33 cm/s MV A velocity: 73.40 cm/s  SHUNTS MV E/A ratio:  1.14        Systemic VTI:  0.24 m Systemic Diam: 2.05 cm  Rachelle Hora Croitoru MD Electronically signed by Thurmon Fair MD Signature Date/Time: 11/28/2021/5:25:48 PM    Final (Updated)              History of Present  Illness:   Jennifer Wall is a 77 y.o. female with the  above problem list.  She presents today for one year follow up, last seen by Dr. Clifton James in March of last year. Patient presents today for one year follow up.     In the last year, reports feeling very well. She and her husband like to travel (have visited all 1 states in the last few years). When the weather is warm, she likes to walk their dog. Denies any exertional limitation. On exam, HR noted to be regular, bradycardic in the upper 40s. Patient denies any symptoms of weakness, presyncope, syncope. Her only symptom reported today is frequent cold hands which she has experienced chronically since starting Eliquis. In reviewing patient's medications today, she reports that she stopped taking Lisinopril for a period of time after it fell off her med list. She resumed 10mg  dose about a month ago. Today patient denies chest pain, shortness of breath, lower extremity edema, fatigue, palpitations, melena, hematuria, hemoptysis, diaphoresis, orthopnea, and PND.      Past Medical History:  Diagnosis Date   Basal cell carcinoma 04/17/2015   Bilateral renal cysts    incidental finding on CT 06/ 2016   Diabetes mellitus type 2, noninsulin dependent (HCC) 09/06/2016   Duplicated ureter, right    per urologist-- dr Vernie Ammons   Essential hypertension 03/23/2015   GERD (gastroesophageal reflux disease)    mild- controls with diet   Hiatal hernia    History of acute pancreatitis    01-14-2006  ([facility])  idiopathic-  resolved   History of atrial fibrillation without current medication currently 09-18-2015 pt denies S & S   post op 04-14-2015 whipple procedure (per pt had work-up done by [facility] cardiologist- pt was released from cardiology care -- no meds or asa)   History of basal cell carcinoma excision    multiple skin removal   History of colon polyps    hyperplastic   History of malignant neuroendocrine neoplasm followed by oncologist-- dr Sherryll Burger at 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)   s/p  fulgeration 03/ 2017, 10/ 2017 (no radiation or chemo)   History of transitional cell carcinoma of kidney 09/06/2016   Hyperlipidemia    Hypertension    Melanoma (HCC) 04/17/2015   Postoperative atrial fibrillation (HCC) 05/22/2015   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   alendronate (FOSAMAX) 70 MG tablet Take 1 mg by mouth once a week.   apixaban (ELIQUIS) 5 MG TABS tablet Take 1 tablet (5 mg total) by mouth 2 (two) times daily.   Ascorbic Acid (VITAMIN C) 500 MG CAPS Take 1 capsule by mouth every morning.   Cholecalciferol (VITAMIN D) 2000 UNITS CAPS Take 1  capsule by mouth 2 (two) times daily.   citalopram (CELEXA) 20 MG tablet Take 20 mg by mouth every morning.   lisinopril (ZESTRIL) 10 MG tablet Take 10 mg by mouth daily.   metoprolol succinate (TOPROL XL) 50 MG 24 hr tablet Take 0.5 tablets (25 mg total) by mouth daily. Take with or immediately following a meal.   simvastatin (ZOCOR) 40 MG tablet Take 40 mg by mouth at bedtime.   spironolactone (ALDACTONE) 25 MG tablet Take 25 mg by mouth daily.   triamcinolone (NASACORT) 55 MCG/ACT AERO nasal inhaler  Place 1 spray into the nose daily as needed.   [DISCONTINUED] metoprolol succinate (TOPROL-XL) 50 MG 24 hr tablet Take 50 mg by mouth daily. Take with or immediately following a meal.   [DISCONTINUED] omega-3 acid ethyl esters (LOVAZA) 1 G capsule Take 2 g by mouth 2 (two) times daily.    Allergies:   Nitrofurantoin, Fenofibrate micronized, Metformin, Metformin and related, Tricor [fenofibrate], and Erythromycin   Social History   Occupational History   Not on file  Tobacco Use   Smoking status: Never   Smokeless tobacco: Never  Vaping Use   Vaping Use: Never used  Substance and Sexual Activity   Alcohol use: Yes    Alcohol/week: 2.0 standard drinks of alcohol    Types: 2 Glasses of wine per week    Comment: occasional   Drug use: Yes   Sexual activity: Yes    Family Hx: The patient's family history includes Breast cancer in her sister; Cancer in her mother; Hypertension in her father and sister.  Review of Systems  Constitutional: Negative for weight gain and weight loss.  Cardiovascular:  Negative for chest pain, dyspnea on exertion, irregular heartbeat, leg swelling, orthopnea, palpitations and syncope.  Respiratory:  Negative for cough and shortness of breath.   Neurological:  Negative for dizziness, light-headedness and weakness.  All other systems reviewed and are negative.    EKGs/Labs/Other Test Reviewed:    EKG:  EKG is ordered today.  The ekg ordered today demonstrates sinus bradycardia with HR 47. No acute ischemic changes. Overall stable with last year's ECG with exception of lower rate.   Recent Labs: No results found for requested labs within last 365 days.   Recent Lipid Panel No results for input(s): "CHOL", "TRIG", "HDL", "VLDL", "LDLCALC", "LDLDIRECT" in the last 8760 hours.    Risk Assessment/Calculations/Metrics:    CHA2DS2-VASc Score = 5   This indicates a 7.2% annual risk of stroke. The patient's score is based upon: CHF History: 0 HTN  History: 1 Diabetes History: 1 Stroke History: 0 Vascular Disease History: 0 Age Score: 2 Gender Score: 1             Physical Exam:    VS:  BP 130/80   Pulse (!) 47   Ht 5\' 2"  (1.575 m)   Wt 106 lb 12.8 oz (48.4 kg)   BMI 19.53 kg/m     Wt Readings from Last 3 Encounters:  01/07/23 106 lb 12.8 oz (48.4 kg)  11/06/21 101 lb 12.8 oz (46.2 kg)  10/05/20 110 lb 12.8 oz (50.3 kg)    Constitutional:      Appearance: Healthy appearance. Not in distress.  Neck:     Vascular: No JVR. JVD normal.  Pulmonary:     Effort: Pulmonary effort is normal.     Breath sounds: Normal breath sounds. No wheezing. No rhonchi. No rales.  Chest:     Chest wall: Not tender  to palpatation.  Cardiovascular:     PMI at left midclavicular line. Bradycardia present. Regular rhythm. Normal S1. Normal S2.      Murmurs: There is no murmur.     No gallop.  No click. No rub.  Pulses:    Intact distal pulses.  Edema:    Peripheral edema absent.  Abdominal:     General: Bowel sounds are normal.     Palpations: Abdomen is soft.     Tenderness: There is no abdominal tenderness.  Musculoskeletal: Normal range of motion.        General: No tenderness. Skin:    General: Skin is warm and dry.  Neurological:     General: No focal deficit present.     Mental Status: Alert and oriented to person, place and time.         ASSESSMENT & PLAN:   PAF (paroxysmal atrial fibrillation) (HCC) Patient with hx paroxysmal afib in setting of pancreatic surgery. No recent symptoms of palpitations, rapid HR, lightheadedness/dizziness. Sinus bradycardia with rate of 47 bpm in clinic today on ECG. No symptoms but given decrease in rate from last year's visit, will decrease Toprol XL to 25mg  daily from 50mg . Patient to keep log of HR over the next week. Continue Eliquis 5mg  BID. Will check CBC today.  Diabetes mellitus type 2, noninsulin dependent (HCC) Last A1c 6.9 as of 06/21/22. Patient previously followed by Dr. Sharl Ma  but has more recently seen PCP for DM care. Encouraged regular follow up to monitor glucose/A1c.  Hyperlipidemia associated with type 2 diabetes mellitus (HCC) Last LDL 44 per 06/21/22 lipid panel. Continue Simvastatin 40mg .  Hypertension Patient with BP 130/80 in clinic today. She occasionally checks BP at home but is not sure what typical readings are. Give above decrease in Toprol XL to 25mg , will have patient check daily BP for the next week and report back. Would consider adding low dose Amlodipine if BP increases. Unclear what prompted initiation of Spironolactone in the past as I do not see hx heart failure and patient does not have problems with lower extremity edema. K was up to 5.1 per 06/21/22 BMP. Will recheck today. If K continues to rise, low threshold for discontinuing. For now, will continue with Spironolactone 25mg  and Lisinopril 10mg .   Secondary hypercoagulable state (HCC) This patients CHA2DS2-VASc Score and unadjusted Ischemic Stroke Rate (% per year) is equal to 7.2 % stroke rate/year from a score of 5. Continue Eliquis 5mg  BID.            Dispo:  6 months  Medication Adjustments/Labs and Tests Ordered: Current medicines are reviewed at length with the patient today.  Concerns regarding medicines are outlined above.  Tests Ordered: Orders Placed This Encounter  Procedures   Basic metabolic panel   CBC   EKG 12-Lead   Medication Changes: Meds ordered this encounter  Medications   metoprolol succinate (TOPROL XL) 50 MG 24 hr tablet    Sig: Take 0.5 tablets (25 mg total) by mouth daily. Take with or immediately following a meal.    Dispense:  45 tablet    Refill:  3   Signed, Perlie Gold, PA-C  01/07/2023 4:50 PM    [facility] [address] [place], [place], [place]  [address] Phone: [phone]; Fax: [phone]

## 2023-01-07 NOTE — Assessment & Plan Note (Addendum)
Patient with hx paroxysmal afib in setting of pancreatic surgery. No recent symptoms of palpitations, rapid HR, lightheadedness/dizziness. Sinus bradycardia with rate of 47 bpm in clinic today on ECG. No symptoms but given decrease in rate from last year's visit, will decrease Toprol XL to 25mg  daily from 50mg . Patient to keep log of HR over the next week. Continue Eliquis 5mg  BID. Will check CBC today.

## 2023-01-07 NOTE — Assessment & Plan Note (Addendum)
Patient with BP 130/80 in clinic today. She occasionally checks BP at home but is not sure what typical readings are. Given above decrease in Toprol XL to 25mg , will have patient check daily BP for the next week and report back. Would consider adding low dose Amlodipine if BP increases. Unclear what prompted initiation of Spironolactone in the past as I do not see hx heart failure and patient does not have problems with lower extremity edema. K was up to 5.1 per 06/21/22 BMP. Will recheck today. If K continues to rise, low threshold for discontinuing Spironolactone. For now, will continue with Spironolactone 25mg  and Lisinopril 10mg  in addition to above reduced dose of Toprol XL, 25mg .

## 2023-01-07 NOTE — Assessment & Plan Note (Signed)
This patients CHA2DS2-VASc Score and unadjusted Ischemic Stroke Rate (% per year) is equal to 7.2 % stroke rate/year from a score of 5. Continue Eliquis 5mg  BID.

## 2023-01-07 NOTE — Assessment & Plan Note (Signed)
Last LDL 44 per 06/21/22 lipid panel. Continue Simvastatin 40mg .

## 2023-01-08 LAB — CBC
Hematocrit: 38.5 % (ref 34.0–46.6)
Hemoglobin: 13.2 g/dL (ref 11.1–15.9)
MCH: 31.1 pg (ref 26.6–33.0)
MCHC: 34.3 g/dL (ref 31.5–35.7)
MCV: 91 fL (ref 79–97)
Platelets: 178 10*3/uL (ref 150–450)
RBC: 4.24 x10E6/uL (ref 3.77–5.28)
RDW: 11.8 % (ref 11.7–15.4)
WBC: 5.3 10*3/uL (ref 3.4–10.8)

## 2023-01-08 LAB — BASIC METABOLIC PANEL
BUN/Creatinine Ratio: 18 (ref 12–28)
BUN: 19 mg/dL (ref 8–27)
CO2: 22 mmol/L (ref 20–29)
Calcium: 10.1 mg/dL (ref 8.7–10.3)
Chloride: 102 mmol/L (ref 96–106)
Creatinine, Ser: 1.06 mg/dL — ABNORMAL HIGH (ref 0.57–1.00)
Glucose: 162 mg/dL — ABNORMAL HIGH (ref 70–99)
Potassium: 5.1 mmol/L (ref 3.5–5.2)
Sodium: 136 mmol/L (ref 134–144)
eGFR: 54 mL/min/{1.73_m2} — ABNORMAL LOW (ref >59)

## 2023-03-13 DIAGNOSIS — L821 Other seborrheic keratosis: Secondary | ICD-10-CM | POA: Diagnosis not present

## 2023-03-13 DIAGNOSIS — D692 Other nonthrombocytopenic purpura: Secondary | ICD-10-CM | POA: Diagnosis not present

## 2023-03-13 DIAGNOSIS — Z85828 Personal history of other malignant neoplasm of skin: Secondary | ICD-10-CM | POA: Diagnosis not present

## 2023-03-13 DIAGNOSIS — Z85820 Personal history of malignant melanoma of skin: Secondary | ICD-10-CM | POA: Diagnosis not present

## 2023-03-13 DIAGNOSIS — D225 Melanocytic nevi of trunk: Secondary | ICD-10-CM | POA: Diagnosis not present

## 2023-03-13 DIAGNOSIS — L570 Actinic keratosis: Secondary | ICD-10-CM | POA: Diagnosis not present

## 2023-03-13 DIAGNOSIS — L814 Other melanin hyperpigmentation: Secondary | ICD-10-CM | POA: Diagnosis not present

## 2023-04-02 DIAGNOSIS — C661 Malignant neoplasm of right ureter: Secondary | ICD-10-CM | POA: Diagnosis not present

## 2023-04-02 DIAGNOSIS — C679 Malignant neoplasm of bladder, unspecified: Secondary | ICD-10-CM | POA: Diagnosis not present

## 2023-04-02 DIAGNOSIS — Z905 Acquired absence of kidney: Secondary | ICD-10-CM | POA: Diagnosis not present

## 2023-04-02 DIAGNOSIS — Z8554 Personal history of malignant neoplasm of ureter: Secondary | ICD-10-CM | POA: Diagnosis not present

## 2023-04-02 DIAGNOSIS — Z08 Encounter for follow-up examination after completed treatment for malignant neoplasm: Secondary | ICD-10-CM | POA: Diagnosis not present

## 2023-04-08 DIAGNOSIS — K8681 Exocrine pancreatic insufficiency: Secondary | ICD-10-CM | POA: Diagnosis not present

## 2023-04-08 DIAGNOSIS — D3A8 Other benign neuroendocrine tumors: Secondary | ICD-10-CM | POA: Diagnosis not present

## 2023-06-18 ENCOUNTER — Ambulatory Visit: Payer: Medicare PPO | Attending: Cardiology | Admitting: Cardiology

## 2023-06-18 ENCOUNTER — Encounter: Payer: Self-pay | Admitting: Cardiology

## 2023-06-18 VITALS — BP 116/64 | HR 55 | Ht 62.0 in | Wt 102.8 lb

## 2023-06-18 DIAGNOSIS — D6869 Other thrombophilia: Secondary | ICD-10-CM

## 2023-06-18 DIAGNOSIS — I10 Essential (primary) hypertension: Secondary | ICD-10-CM | POA: Diagnosis not present

## 2023-06-18 DIAGNOSIS — E1169 Type 2 diabetes mellitus with other specified complication: Secondary | ICD-10-CM | POA: Diagnosis not present

## 2023-06-18 DIAGNOSIS — E785 Hyperlipidemia, unspecified: Secondary | ICD-10-CM | POA: Diagnosis not present

## 2023-06-18 DIAGNOSIS — I480 Paroxysmal atrial fibrillation: Secondary | ICD-10-CM

## 2023-06-18 NOTE — Patient Instructions (Addendum)
Medication Instructions:  DISCONTINUE Spironolactone  *If you need a refill on your cardiac medications before your next appointment, please call your pharmacy*   Lab Work: Have your pcp send our office a copy of your labs    Testing/Procedures: None ordered   Follow-Up: At [facility], you and your health needs are our priority.  As part of our continuing mission to provide you with exceptional heart care, we have created designated Provider Care Teams.  These Care Teams include your primary Cardiologist (physician) and Advanced Practice Providers (APPs -  Physician Assistants and Nurse Practitioners) who all work together to provide you with the care you need, when you need it.  We recommend signing up for the patient portal called "MyChart".  Sign up information is provided on this After Visit Summary.  MyChart is used to connect with patients for Virtual Visits (Telemedicine).  Patients are able to view lab/test results, encounter notes, upcoming appointments, etc.  Non-urgent messages can be sent to your provider as well.   To learn more about what you can do with MyChart, go to [email or web address].    Your next appointment:   6 month(s)  Provider:   Verne Carrow, MD or Perlie Gold, PA-C   Other Instructions Check your blood pressure daily for 2 weeks, then contact the office with your readings.  Make sure to check 2 hours after your medications.   AVOID these things for 30 minutes before checking your blood pressure: No Drinking caffeine. No Drinking alcohol. No Eating. No Smoking. No Exercising.  Five minutes before checking your blood pressure: Pee. Sit in a dining chair. Avoid sitting in a soft couch or armchair. Be quiet. Do not talk.

## 2023-06-18 NOTE — Progress Notes (Unsigned)
Cardiology Office Note:   Date:  06/19/2023  ID:  Jennifer Wall, DOB June 11, 1946, MRN [id number] PCP: Daisy Floro, MD  [facility] Providers Cardiologist:  Verne Carrow, MD    History of Present Illness:   Patient Profile:   Paroxysmal atrial fibrillation Onset following pancreatic surgery. 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  Patient with above medical history presents today for follow up. At her last visit with me, we adjusted her metoprolol dosage from 50mg  to 25mg  due to HR<50 observed in the clinic. Patient was asymptomatic at that time and reports that she continues to feel well. She denies experiencing dizziness, lightheadedness, decreased exertional tolerance, increased fatigue, shortness of breath, chest pain, or palpitations.  The patient's blood pressure has remained stable since the metoprolol dosage adjustment, and her heart rate has increased. Patient is also on Spironolactone and at her previous visit with me, we discussed that this can result in elevated K levels. When I last checked, K was 5.1. However, recent August labs from another provider have shown an elevated potassium level of 5.6. The patient and spouse are still unsure of the initial reason for the spironolactone prescription.  The patient's cholesterol has been well-controlled on simvastatin 40mg , with an LDL of 44 as of last November. She is also on Eliquis 5mg  for her AFib. The patient is scheduled for labs with her primary care physician, which will provide further insight into her current metabolic panel and complete blood count.     Today patient denies chest pain, shortness of breath, lower extremity edema,  fatigue, palpitations, melena, hematuria, hemoptysis, diaphoresis, weakness, presyncope, syncope, orthopnea, and PND.   Studies Reviewed:    Outside labs review:  04/02/23 CMP at [facility]:  Creatinine:1 Potassium: 5.6  Risk Assessment/Calculations:    CHA2DS2-VASc Score = 5   This indicates a 7.2% annual risk of stroke. The patient's score is based upon: CHF History: 0 HTN History: 1 Diabetes History: 1 Stroke History: 0 Vascular Disease History: 0 Age Score: 2 Gender Score: 1             Physical Exam:   VS:  BP 116/64   Pulse (!) 55   Ht 5\' 2"  (1.575 m)   Wt 102 lb 12.8 oz (46.6 kg)   SpO2 97%   BMI 18.80 kg/m    Wt Readings from Last 3 Encounters:  06/18/23 102 lb 12.8 oz (46.6 kg)  01/07/23 106 lb 12.8 oz (48.4 kg)  11/06/21 101 lb 12.8 oz (46.2 kg)     Physical Exam Vitals reviewed.  Constitutional:      Appearance: Normal appearance.  HENT:     Head: Normocephalic.     Nose: Nose normal.  Eyes:     Pupils: Pupils are equal, round, and reactive to light.  Cardiovascular:     Rate and Rhythm: Regular rhythm. Bradycardia present.     Pulses: Normal pulses.     Heart sounds: Normal heart sounds. No murmur heard.    No friction rub. No gallop.  Pulmonary:     Effort: Pulmonary effort is normal.     Breath sounds: Normal breath sounds.  Musculoskeletal:     Right lower leg: No edema.     Left lower leg: No edema.  Skin:    General: Skin is warm and dry.     Capillary Refill: Capillary refill takes less than 2 seconds.  Neurological:     General: No focal deficit present.     Mental Status: She is alert and oriented to person, place, and time.  Psychiatric:        Mood and Affect: Mood normal.        Behavior: Behavior normal.        Thought Content: Thought content normal.        Judgment: Judgment normal.     ASSESSMENT AND PLAN:     Assessment and Plan     PAF (paroxysmal atrial fibrillation) (HCC) Patient with hx paroxysmal afib in  setting of pancreatic surgery. No recent symptoms of palpitations, rapid HR, lightheadedness/dizziness. Previously on Metoprolol 50mg  with heart rate of 47. Dose reduced to 25mg  with improvement in heart rate to 55. No symptoms of dizziness, decreased exertional tolerance, shortness of breath, chest pain, or palpitations. -Continue Metoprolol 25mg  daily. -Continue Eliquis 5mg  BID  Secondary hypercoagulable state (HCC) This patients CHA2DS2-VASc Score and unadjusted Ischemic Stroke Rate (% per year) is equal to 7.2 % stroke rate/year from a score of 5. Continue Eliquis 5mg  BID.  Hypertension Well controlled on current regimen. Potassium level elevated at 5.6 in August, potentially due to Spironolactone. -Discontinue Spironolactone due to elevated potassium and lack of clear indication for use. -Monitor blood pressure closely over the next few weeks. If blood pressure increases, consider increasing Lisinopril from 10mg  to 20mg  daily.  Hyperlipidemia Well controlled on Simvastatin 40mg  with LDL of 44. -Continue Simvastatin 40mg  daily. -Labs pending with PCP  General Health Maintenance -Patient having labs drawn with primary care physician tomorrow. Request that results be sent to this office for review.  -Follow up in 6 months            Signed, Perlie Gold, PA-C

## 2023-06-19 DIAGNOSIS — D509 Iron deficiency anemia, unspecified: Secondary | ICD-10-CM | POA: Diagnosis not present

## 2023-06-19 DIAGNOSIS — E782 Mixed hyperlipidemia: Secondary | ICD-10-CM | POA: Diagnosis not present

## 2023-06-19 DIAGNOSIS — E1169 Type 2 diabetes mellitus with other specified complication: Secondary | ICD-10-CM | POA: Diagnosis not present

## 2023-06-19 DIAGNOSIS — M810 Age-related osteoporosis without current pathological fracture: Secondary | ICD-10-CM | POA: Diagnosis not present

## 2023-06-19 DIAGNOSIS — I10 Essential (primary) hypertension: Secondary | ICD-10-CM | POA: Diagnosis not present

## 2023-06-19 LAB — LAB REPORT - SCANNED
A1c: 8.3
EGFR: 46

## 2023-06-30 ENCOUNTER — Encounter: Payer: Self-pay | Admitting: Family Medicine

## 2023-06-30 DIAGNOSIS — E538 Deficiency of other specified B group vitamins: Secondary | ICD-10-CM | POA: Diagnosis not present

## 2023-06-30 DIAGNOSIS — I4891 Unspecified atrial fibrillation: Secondary | ICD-10-CM | POA: Diagnosis not present

## 2023-06-30 DIAGNOSIS — I700 Atherosclerosis of aorta: Secondary | ICD-10-CM | POA: Diagnosis not present

## 2023-06-30 DIAGNOSIS — E1122 Type 2 diabetes mellitus with diabetic chronic kidney disease: Secondary | ICD-10-CM | POA: Diagnosis not present

## 2023-06-30 DIAGNOSIS — F341 Dysthymic disorder: Secondary | ICD-10-CM | POA: Diagnosis not present

## 2023-06-30 DIAGNOSIS — E1169 Type 2 diabetes mellitus with other specified complication: Secondary | ICD-10-CM | POA: Diagnosis not present

## 2023-06-30 DIAGNOSIS — E559 Vitamin D deficiency, unspecified: Secondary | ICD-10-CM | POA: Diagnosis not present

## 2023-06-30 DIAGNOSIS — E1165 Type 2 diabetes mellitus with hyperglycemia: Secondary | ICD-10-CM | POA: Diagnosis not present

## 2023-06-30 DIAGNOSIS — D6869 Other thrombophilia: Secondary | ICD-10-CM | POA: Diagnosis not present

## 2023-06-30 DIAGNOSIS — Z0000 Encounter for general adult medical examination without abnormal findings: Secondary | ICD-10-CM | POA: Diagnosis not present

## 2023-07-21 ENCOUNTER — Other Ambulatory Visit: Payer: Self-pay | Admitting: Gastroenterology

## 2023-07-21 DIAGNOSIS — K862 Cyst of pancreas: Secondary | ICD-10-CM

## 2023-09-04 ENCOUNTER — Ambulatory Visit
Admission: RE | Admit: 2023-09-04 | Discharge: 2023-09-04 | Discharge status: Home / Self Care | Payer: Medicare PPO | Source: Ambulatory Visit | Attending: Gastroenterology | Admitting: Gastroenterology

## 2023-09-04 DIAGNOSIS — K862 Cyst of pancreas: Secondary | ICD-10-CM

## 2023-09-04 DIAGNOSIS — Z905 Acquired absence of kidney: Secondary | ICD-10-CM | POA: Diagnosis not present

## 2023-09-04 MED ORDER — GADOPICLENOL 0.5 MMOL/ML IV SOLN
5.0000 mL | Freq: Once | INTRAVENOUS | Status: AC | PRN
  Administered 2023-09-04: 5 mL via INTRAVENOUS

## 2023-09-08 ENCOUNTER — Ambulatory Visit (INDEPENDENT_AMBULATORY_CARE_PROVIDER_SITE_OTHER): Payer: Medicare PPO | Admitting: Neurology

## 2023-09-08 ENCOUNTER — Encounter: Payer: Self-pay | Admitting: Neurology

## 2023-09-08 VITALS — BP 136/66 | HR 48 | Ht 62.0 in | Wt 104.6 lb

## 2023-09-08 DIAGNOSIS — F02B18 Dementia in other diseases classified elsewhere, moderate, with other behavioral disturbance: Secondary | ICD-10-CM | POA: Diagnosis not present

## 2023-09-08 DIAGNOSIS — R6889 Other general symptoms and signs: Secondary | ICD-10-CM | POA: Diagnosis not present

## 2023-09-08 DIAGNOSIS — I480 Paroxysmal atrial fibrillation: Secondary | ICD-10-CM | POA: Diagnosis not present

## 2023-09-08 DIAGNOSIS — R413 Other amnesia: Secondary | ICD-10-CM | POA: Diagnosis not present

## 2023-09-08 DIAGNOSIS — G20C Parkinsonism, unspecified: Secondary | ICD-10-CM | POA: Diagnosis not present

## 2023-09-08 MED ORDER — DONEPEZIL HCL 5 MG PO TABS: 5.0000 mg | ORAL_TABLET | Freq: Every day | ORAL | 5 refills | Status: DC

## 2023-09-08 NOTE — Progress Notes (Addendum)
[facility]  Provider:  Dr Saige Busby Referring Provider: Daisy Floro, MD Primary Care Physician:  Daisy Floro, MD  Chief Complaint  Patient presents with   New Patient (Initial Visit)    Pt in room 1, Jennifer Wall husband in room. New patient here for memory changes. Short term memory changes, forgets words when talking.    HPI:  Jennifer Wall is a 78 y.o. female and seen here upon referral from Dr. Daisy Floro for a Consultation/ Evaluation of a memory disorder. The patient is pleasant but slowed, it takes a while to answer any question and any answer is vague, small talk, hesitant speech.  Looking for work.   The patient was not able to perform on the [facility] test , was changed to MMSE and reached here only 21/ 30 points tearful, agitated, anxious while trying MOCA.   The patient had a  back surgery, had whipple surgery /2015 pancreatitis, nephrectomy 2018, has DM and lost weight.  Family history: her  father died at age 72 , she was 54 at the time, he had headaches and died of HTN.  Mother died at age 32 of cancer ,metastatic to the liver . Siblings: one sister age 35 , 3 years junior, with breast cancer.   Social history,  married with 2 adult children, 5 grandchildren.  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  After high school  went to college at [employer] ,  teaching elementary school.  These facts tok a long time to extract. 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  Never been a smoker,  drinking wine some nights, but no excesses.  This patient reports onset of memory loss  over a period of 24 months,  trouble to remember names ,  dates and appointments, looking for words. Doesn't do any banking, can't remember pin number. Husband handles all finances, for several years.  The couple  has social activities, but her husband nodded that she is not  going anymore neither to  book club nor bridge club.  She has developed difficulties to play bridge.  Still driving and stated she has not gotten lost.  Established routines daily life, ADL, she gets up at 7.30, breakfast together, reads a newspaper,  She doesn't use the treadmill. She likes outdoor activity but only iif its warm and not rainy..  The cold owns a dog that her husband walks.   Review of Systems: Out of a complete 14 system review, the patient complains of only the following symptoms, and all other reviewed systems are negative. GDS 4/ 15 points  Epworth Sleepiness score: NA  Social History   Socioeconomic History   Marital status: Married    Spouse name: Not on file   Number of children: 2   Years of education: Not on file   Highest education level: Not on file  Occupational History   Not on file  Tobacco Use   Smoking status: Never   Smokeless tobacco: Never  Vaping Use   Vaping status: Never Used  Substance and Sexual Activity   Alcohol use: Yes    Alcohol/week: 2.0 standard drinks of alcohol    Types: 2 Glasses of wine per week    Comment: occasional   Drug use: Not Currently   Sexual activity: Yes  Other Topics Concern   Not on file  Social History Narrative   Right handed  Wear glasses    1 cup coffee per day   Social Drivers of Health   Financial Resource Strain: Not on file  Food Insecurity: Not on file  Transportation Needs: Not on file  Physical Activity: Not on file  Stress: Not on file  Social Connections: Not on file  Intimate Partner Violence: Not on file    Family History  Problem Relation Age of Onset   Cancer Mother    Hypertension Father    Breast cancer Sister    Hypertension Sister     Past Medical History:  Diagnosis Date   Basal cell carcinoma 04/17/2015   Bilateral renal cysts    incidental finding on CT 06/ 2016   Diabetes mellitus type 2, noninsulin dependent (HCC) 09/06/2016   Duplicated ureter, right    per  urologist-- dr Vernie Ammons   Essential hypertension 03/23/2015   GERD (gastroesophageal reflux disease)    mild- controls with diet   Hiatal hernia    History of acute pancreatitis    01-14-2006  ([facility])  idiopathic-  resolved   History of atrial fibrillation without current medication currently 09-18-2015 pt denies S & S   post op 04-14-2015 whipple procedure (per pt had work-up done by [facility] cardiologist- pt was released from cardiology care -- no meds or asa)   History of basal cell carcinoma excision    multiple skin removal   History of colon polyps    hyperplastic   History of malignant neuroendocrine neoplasm followed by oncologist-- dr Sherryll Burger at [facility]--- per last note 09-19-2016 no recurrence   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  s/p  whipple (T3 N0 M0, Grade 1) negative margins and nodes   History of melanoma excision    1989 left thigh   History of pancreatic surgery    08/ 2016  s/p  whipple for neuroendocrine neoplasm tumor   History of renal cell cancer UROLOGIST-  DR OTTELIN   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)   s/p  fulgeration 03/ 2017, 10/ 2017 (no radiation or chemo)   History of transitional cell carcinoma of kidney 09/06/2016   Hyperlipidemia    Hypertension    Melanoma (HCC) 04/17/2015   Postoperative atrial fibrillation (HCC) 05/22/2015   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   Supraventricular tachycardia (HCC)    diagnosed at [facility] by monitor 2017   Supraventricular tachycardia, paroxysmal (HCC) 06/12/2015   Type 2 diabetes mellitus (HCC)    Ureteral cancer, 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   Ventral hernia     Past Surgical History:  Procedure Laterality Date   CATARACT EXTRACTION W/ INTRAOCULAR LENS  IMPLANT, BILATERAL  2010   COLONOSCOPY  last one 2015   CYSTOSCOPY WITH RETROGRADE PYELOGRAM, URETEROSCOPY AND STENT PLACEMENT Right 09/22/2015   Procedure: CYSTOSCOPY WITH RIGHT RETROGRADE PYELOGRAM,  URETEROSCOPY AND STENT PLACEMENT, BIOPSY;  Surgeon: Ihor Gully, MD;  Location[facility];  Service: Urology;  Laterality: Right;   CYSTOSCOPY WITH RETROGRADE PYELOGRAM, URETEROSCOPY AND STENT PLACEMENT Right 11/13/2015   Procedure: CYSTOSCOPY WITH RIGHT RETROGRADE PYELOGRAM, URETEROSCOPY BIOPSY, FULGURATION  AND STENT ;  Surgeon: Ihor Gully, MD;  Location: [facility] [place];  Service: Urology;  Laterality: Right;   CYSTOSCOPY WITH URETEROSCOPY AND STENT PLACEMENT Right 06/17/2016   Procedure: CYSTOSCOPY , URETEROSCOPY  WITH  FULGUTATION;  Surgeon: Ihor Gully, MD;  Location: [facility] [place];  Service: Urology;  Laterality: Right;   CYSTOSCOPY/RETROGRADE/URETEROSCOPY Right 04/14/2017   Procedure: CYSTOSCOPY/RETROGRADE/URETEROSCOPY/ FULGURATION OF  UPPER POLE TUMOR;  Surgeon: Ihor Gully, MD;  Location: [facility];  Service: Urology;  Laterality: Right;   DIAGNOSTIC LAPAROSCOPY  04/14/2015   per office note from [facility] 07/2015   EUS N/A 02/22/2015   Procedure: UPPER ENDOSCOPIC ULTRASOUND (EUS) RADIAL;  Surgeon: Willis Modena, MD;  Location: [facility];  Service: Endoscopy;  Laterality: N/A;   KIDNEY SURGERY Right    right kindey removed   LUMBAR DISC SURGERY  2000   L4 - L5   MELANOMA EXCISION  1989   left thigh   PANCREATICODUODENECTOMY  04-14-2015   at [facility]   malignant neuroendocrine pancreatic tumor   PULLEY RELEASE RIGHT THUMB  02-01-2005    Current Outpatient Medications  Medication Sig Dispense Refill   acetaminophen (TYLENOL) 500 MG tablet Take 1,000 mg by mouth every 6 (six) hours as needed for moderate pain or headache.     alendronate (FOSAMAX) 70 MG tablet Take 1 mg by mouth once a week.     apixaban (ELIQUIS) 5 MG TABS tablet Take 1 tablet (5 mg total) by mouth 2 (two) times daily. 180 tablet 1   citalopram (CELEXA) 20 MG tablet Take 20 mg by mouth every morning.     hydrochlorothiazide (MICROZIDE) 12.5 MG capsule Take 12.5  mg by mouth daily.     lisinopril (ZESTRIL) 10 MG tablet Take 20 mg by mouth daily.     metoprolol succinate (TOPROL XL) 50 MG 24 hr tablet Take 0.5 tablets (25 mg total) by mouth daily. Take with or immediately following a meal. 45 tablet 3   Polyethylene Glycol 3350 (MIRALAX PO) Take by mouth. 1 cup daily as needed     simvastatin (ZOCOR) 40 MG tablet Take 40 mg by mouth at bedtime.     triamcinolone (NASACORT) 55 MCG/ACT AERO nasal inhaler Place 1 spray into the nose daily as needed.     Ascorbic Acid (VITAMIN C) 500 MG CAPS Take 1 capsule by mouth every morning. (Patient not taking: Reported on 06/18/2023)     Cholecalciferol (VITAMIN D) 2000 UNITS CAPS Take 1 capsule by mouth 2 (two) times daily. (Patient not taking: Reported on 06/18/2023)     No current facility-administered medications for this visit.    Allergies as of 09/08/2023 - Review Complete 09/08/2023  Allergen Reaction Noted   Nitrofurantoin Other (See Comments) 08/26/2017   Fenofibrate micronized Other (See Comments) 02/21/2015   Metformin Other (See Comments) 01/15/2016   Metformin and related Other (See Comments) 01/15/2016   Tricor [fenofibrate] Other (See Comments) 02/21/2015   Erythromycin Rash 02/21/2015    Vitals: BP 136/66 (BP Location: Left Arm, Patient Position: Sitting, Cuff Size: Normal)   Pulse (!) 48   Ht 5\' 2"  (1.575 m)   Wt 104 lb 9.6 oz (47.4 kg)   BMI 19.13 kg/m  Last Weight:  Wt Readings from Last 1 Encounters:  09/08/23 104 lb 9.6 oz (47.4 kg)   Last Height:   Ht Readings from Last 1 Encounters:  09/08/23 5\' 2"  (1.575 m)   Physical exam:  General: The patient is awake, alert and appears not in acute distress.  The patient is well groomed. Head: Normocephalic, atraumatic.  Neck is supple. No Goiter.   Neck circumference:12.5"  Cardiovascular:  Regular rate and palpable peripheral pulse:  Respiratory: clear to auscultation.  Mallampati 1, Skin:  Without  evidence of edema, or  rash Trunk:  normal posture.   Neurologic exam : The patient is awake and alert, oriented to place  and time.   Memory subjective  described as impaired.  There is a reduced  attention span & concentration ability.  Speech is fluent without  dysarthria, dysphonia or aphasia.  Mood and affect are appropriate.  Cranial nerves: Pupils are equal and briskly reactive to light. Funduscopic exam without  evidence of pallor or edema. Extraocular movements  in vertical and horizontal planes intact and without nystagmus. Visual fields by finger perimetry are intact. Hearing to finger rub intact.  Facial sensation intact to fine touch. Facial motor strength is symmetric and tongue and uvula move midline.  Motor exam:   coarsely elevated tone and reduced  muscle bulk and symmetric normal strength in all extremities. Grip Strength equally reduced   Proximal strength of shoulder muscles and hip flexors was reduced. Muscle mass is low.   Sensory:  Fine touch and vibration were tested . Proprioception was tested in the upper extremities only and was  normal.  Coordination: Rapid alternating movements in the fingers/hands were normal.  Finger-to-nose maneuver was tested and showed no evidence of ataxia, dysmetria or tremor.  Gait and station: Patient walked without assistive device .  Core Strength within normal limits. Stance is stable and of normal base.  Tandem gait is not tested , turns with 4 Steps are unfragmented.  Deep tendon reflexes: in the  upper and lower extremities are symmetric and without Clonus.    Assessment: Total time for face to face interview and examination, for review of  images and laboratory testing, neurophysiology testing and pre-existing records, including out-of -network , was 55 minutes. Assessment is as follows here:   I have the pleasure of meeting Jennifer Jupiter. Wall today here with her husband, the patient presents with delayed recall, short-term memory difficulties  word-finding difficulties.  She also has a significant medical history including kidney cancer and pancreatic neuro-endocrine cancer.  Last year she was diagnosed with vitamin B12 deficiency and she received supplements.   In 1989 was the first constant ever a melanoma of the left thigh.  She had a Whipple procedure for a neuroendocrine tumor in 2016 she had a partial right nephrectomy in 2018 and another right kidney counts of also removed in April 2019.  She has Fuchs corneal dystrophy, atrial fibrillation which is followed by Dr. Clifton James.   Her CBC with differential from October of last year (2024) was excellent:  she does not have elevated or decreased blood cell counts.  Although in normal range her metabolic panel showed a high glucose level but she may not have been fasting it was drawn at 10:30 AM and is possible that she ate.   The glomerular filtration rate was slightly reduced 46 and her bilirubin was up at 1.1 mg/dL.  Vitamin D level was on the low normal level 30.8 again October 2024.  And her triglycerides are elevated but her total cholesterol was fine.   1) Dementia by degree of impairment in MOCA ( severe) and by MMSE ( moderate ). In contrast to her excellent education and career achievement, this is a progressed stage of Neurocognitive impairment : Unable to draw  the trail, the cube, unable to draw a clock face, unable to recall words ( STM.)and neologisms were evident in the exam.   PLUS :   2) her elevated muscle tone has been associated with cogwheel rigidity ( watching for lewy body )   3) her personality has changed, her bridge friends have been " unfriended" . She reported no longer enjoying the  company of the bridge players , she felt humiliated by  comments / looks? Her husband seems to think this is imagination- but confirmed that she became  the slowest , most hesitant player and that therefor she withdrew from the group / no visual hallucinations.   4) She reports  being a good sleeper, no REM BD, no RLS> husband confirmed.   5) dysautonomia: bradycardia, POTs history - Dr Margarito Courser. Can she tolerate 5 mg Aricept po ?  I like to try at least, I wrote for 5 mg po. .  6 )  her previously dx Vitamin  B 12 deficiency should continuously be treated. I recommend a sublingual wafer, this can be an OTC supplement .  7) atrial fib, check MRI to rule out vascular dementia forms.   Plan:  Treatment plan and additional workup planned after today includes:   1)  complete [facility] dementia lab panel, ATN and apolipoprotein/ inherited risk for dementia.  2)  possible parkinsonism with rigor could indicate beginning of Lewy body. No tremor.  3) not a candidate for MAB therapy (low MMSE score of 21/ 30. (MOCA 14/ 30)  RV in 4-6 months.  New MMSE at that time.   Melvyn Novas, MD

## 2023-09-08 NOTE — Patient Instructions (Signed)
Donepezil Tablets What is this medication? DONEPEZIL (doe NEP e zil) treats memory loss and confusion (dementia) in people who have Alzheimer disease. It works by improving attention, memory, and the ability to engage in daily activities. It is not a cure for dementia or Alzheimer disease. This medicine may be used for other purposes; ask your health care provider or pharmacist if you have questions. COMMON BRAND NAME(S): Aricept What should I tell my care team before I take this medication? They need to know if you have any of these conditions: Head injury Heart disease Irregular heartbeat or rhythm Liver disease Lung or breathing disease, such as asthma Seizures Stomach ulcers, other stomach or intestine problems Stomach bleeding Trouble passing urine An unusual or allergic reaction to donepezil, other medications, foods, dyes, or preservatives Pregnant or trying to get pregnant Breastfeeding How should I use this medication? Take this medication by mouth with a glass of water. Follow the directions on the prescription label. You may take this medication with or without food. Take this medication at regular intervals. This medication is usually taken before bedtime. Do not take it more often than directed. Continue to take your medication even if you feel better. Do not stop taking except on your care team's advice. If you are taking the 23 mg donepezil tablet, swallow it whole; do not cut, crush, or chew it. Talk to your care team about the use of this medication in children. Special care may be needed. Overdosage: If you think you have taken too much of this medicine contact a poison control center or emergency room at once. NOTE: This medicine is only for you. Do not share this medicine with others. What if I miss a dose? If you miss a dose, take it as soon as you can. If it is almost time for your next dose, take only that dose, do not take double or extra doses. What may interact  with this medication? Do not take this medication with any of the following: Certain medications for fungal infections, such as itraconazole, fluconazole, posaconazole, voriconazole Cisapride Dextromethorphan; quinidine Dronedarone Pimozide Quinidine Thioridazine This medication may also interact with the following: Antihistamines for allergy, cough, and cold Atropine Bethanechol Carbamazepine Certain medications for bladder problems, such as oxybutynin or tolterodine Certain medications for Parkinson disease, such as benztropine or trihexyphenidyl Certain medications for stomach problems, such as dicyclomine or hyoscyamine Certain medications for travel sickness, such as scopolamine Dexamethasone Dofetilide Ipratropium NSAIDs, medications for pain and inflammation, such as ibuprofen or naproxen Other medications for Alzheimer disease Other medications that cause heart rhythm changes Phenobarbital Phenytoin Rifampin, rifabutin, or rifapentine Ziprasidone This list may not describe all possible interactions. Give your health care provider a list of all the medicines, herbs, non-prescription drugs, or dietary supplements you use. Also tell them if you smoke, drink alcohol, or use illegal drugs. Some items may interact with your medicine. What should I watch for while using this medication? Visit your care team for regular checks on your progress. Tell your care team if your symptoms do not start to get better or if they get worse. This medication may affect your coordination, reaction time, or judgment. Do not drive or operate machinery until you know how this medication affects you. Sit up or stand slowly to reduce the risk of dizzy or fainting spells. Drinking alcohol with this medication can increase the risk of these side effects. What side effects may I notice from receiving this medication? Side effects that you should report  to your care team as soon as possible: Allergic  reactions--skin rash, itching, hives, swelling of the face, lips, tongue, or throat Peptic ulcer--burning stomach pain, loss of appetite, bloating, burping, heartburn, nausea, vomiting Seizures Slow heartbeat--dizziness, feeling faint or lightheaded, confusion, trouble breathing, unusual weakness or fatigue Stomach bleeding--bloody or black, tar-like stools, vomiting blood or brown material that looks like coffee grounds Trouble passing urine Side effects that usually do not require medical attention (report these to your care team if they continue or are bothersome): Diarrhea Fatigue Loss of appetite Muscle pain or cramps Nausea Trouble sleeping This list may not describe all possible side effects. Call your doctor for medical advice about side effects. You may report side effects to FDA at 1-800-FDA-1088. Where should I keep my medication? Keep out of reach of children. Store at room temperature between 15 and 30 degrees C (59 and 86 degrees F). Throw away any unused medication after the expiration date. NOTE: This sheet is a summary. It may not cover all possible information. If you have questions about this medicine, talk to your doctor, pharmacist, or health care provider.  2024 Elsevier/Gold Standard (2023-02-13 00:00:00)  Memory Compensation Strategies  Use "WARM" strategy.  W= write it down  A= associate it  R= repeat it  M= make a mental note  2.   You can keep a Glass blower/designer.  Use a 3-ring notebook with sections for the following: calendar, important names and phone numbers,  medications, doctors' names/phone numbers, lists/reminders, and a section to journal what you did  each day.   3.    Use a calendar to write appointments down.  4.    Write yourself a schedule for the day.  This can be placed on the calendar or in a separate section of the Memory Notebook.  Keeping a  regular schedule can help memory.  5.    Use medication organizer with sections for each day or  morning/evening pills.  You may need help loading it  6.    Keep a basket, or pegboard by the door.  Place items that you need to take out with you in the basket or on the pegboard.  You may also want to  include a message board for reminders.  7.    Use sticky notes.  Place sticky notes with reminders in a place where the task is performed.  For example: " turn off the  stove" placed by the stove, "lock the door" placed on the door at eye level, " take your medications" on  the bathroom mirror or by the place where you normally take your medications.  8.    Use alarms/timers.  Use while cooking to remind yourself to check on food or as a reminder to take your medicine, or as a  reminder to make a call, or as a reminder to perform another task, etc. Management of Memory Problems  There are some general things you can do to help manage your memory problems.  Your memory may not in fact recover, but by using techniques and strategies you will be able to manage your memory difficulties better.  1)  Establish a routine. Try to establish and then stick to a regular routine.  By doing this, you will get used to what to expect and you will reduce the need to rely on your memory.  Also, try to do things at the same time of day, such as taking your medication or checking your calendar first thing  in the morning. Think about think that you can do as a part of a regular routine and make a list.  Then enter them into a daily planner to remind you.  This will help you establish a routine.  2)  Organize your environment. Organize your environment so that it is uncluttered.  Decrease visual stimulation.  Place everyday items such as keys or cell phone in the same place every day (ie.  Basket next to front door) Use post it notes with a brief message to yourself (ie. Turn off light, lock the door) Use labels to indicate where things go (ie. Which cupboards are for food, dishes, etc.) Keep a notepad and pen by  the telephone to take messages  3)  Memory Aids A diary or journal/notebook/daily planner Making a list (shopping list, chore list, to do list that needs to be done) Using an alarm as a reminder (kitchen timer or cell phone alarm) Using cell phone to store information (Notes, Calendar, Reminders) Calendar/White board placed in a prominent position Post-it notes  In order for memory aids to be useful, you need to have good habits.  It's no good remembering to make a note in your journal if you don't remember to look in it.  Try setting aside a certain time of day to look in journal.  4)  Improving mood and managing fatigue. There may be other factors that contribute to memory difficulties.  Factors, such as anxiety, depression and tiredness can affect memory. Regular gentle exercise can help improve your mood and give you more energy. Simple relaxation techniques may help relieve symptoms of anxiety Try to get back to completing activities or hobbies you enjoyed doing in the past. Learn to pace yourself through activities to decrease fatigue. Find out about some local support groups where you can share experiences with others. Try and achieve 7-8 hours of sleep at night.Problems With Thinking and Memory (Mild Neurocognitive Disorder): What to Know Mild neurocognitive disorder, formerly known as mild cognitive impairment, is a disorder where your memory doesn't work as well as it should. It may also affect other mental abilities like thinking, communicating, behavior, and being able to finish tasks. These problems can be noticed and measured. But they usually don't stop you from doing daily activities or living on your own. Mild neurocognitive disorder usually happens after 79 years of age. But it can also happen at younger ages. It's not as serious as major neurocognitive disorder, also known as dementia, but it may be the first sign of it. In general, the symptoms of this condition get worse  over time. In rare cases, symptoms can get better. What are the causes? This condition may be caused by: Brain disorders like Alzheimer's disease, Parkinson's disease, and other conditions that slowly damage nerve cells. Diseases that affect the blood vessels in the brain and cause small strokes. Certain infections, like HIV. Traumatic brain injury. Other medical conditions, such as brain tumors, underactive thyroid (hypothyroidism), and not having enough vitamin B12. Using certain drugs or medicines. What increases the risk? Being older than 79 years of age. Being female. Having a lower level of education. Diabetes, high blood pressure, high cholesterol, and other conditions that raise the risk for blood vessel diseases. Untreated or undertreated sleep apnea. Having a certain type of gene that can be inherited, or passed down from parent to child. Long-term health problems like heart disease, lung disease, liver disease, kidney disease, or depression. What are the signs or symptoms? Trouble  remembering things. You may: Forget names, phone numbers, or details of recent events. Forget about social events and appointments. Often forget where you put your car keys or other items. Trouble thinking and solving problems. You may have trouble with complex tasks like: Paying bills. Driving in places you don't know well. Trouble communicating. You may have trouble: Finding the right word or naming an object. Forming a sentence that makes sense. Understanding what you read or hear. Changes in your behavior or personality. When this happens, you may: Lose interest in the things you used to enjoy. Avoid being around people. Get angry more easily than usual. Act before thinking. How is this diagnosed? This condition is diagnosed based on: Your symptoms. Your health care provider may ask you and the people you spend time with, like family and friends, about your symptoms. Memory tests and other  tests to check how your brain is working. Your provider may refer you to a provider called a neurologist or a mental health specialist. To try to find out the cause of your condition, your provider may: Get a detailed medical history. Ask about use of alcohol, drugs, and medicines. Do a physical exam. Order blood tests and brain imaging tests. How is this treated? Mild neurocognitive disorder that's caused by medicine use, drug use, infection, or another medical condition may get better when the cause is treated, or when medicines or drugs are stopped. If this disorder has another cause, it usually doesn't improve and may get worse. In these cases, the goal of treatment is to help you manage the symptoms. This may include: Medicines to help with memory and behavior symptoms. Talk therapy. This provides education, emotional support, memory aids, and other ways of making up for problems with mental tasks. Lifestyle changes. These may include: Getting regular exercise. Eating a healthy diet that includes omega-3 fatty acids. Doing things to challenge your thinking and memory skills. Spending more time being with and talking to other people. Using routines like having regular times for meals and going to bed. Follow these instructions at home: Eating and drinking  Drink more fluids as told. Eat a healthy diet that includes omega-3 fatty acids. These can be found in: Fish. Nuts. Leafy vegetables. Vegetable oils. If you drink alcohol: Limit how much you have to: 0-1 drink a day if you're female. 0-2 drinks a day if you're female. Know how much alcohol is in your drink. In the [place] one drink is one 12 oz bottle of beer (355 mL), one 5 oz glass of wine (148 mL), or one 1 oz glass of hard liquor (44 mL). Lifestyle  Get regular exercise as told by your provider. Do not smoke, vape, or use nicotine or tobacco. Use healthy ways to manage stress. If you need help managing stress, ask your  provider. Keep spending time with other people. Keep your mind active by doing activities you enjoy, like reading or playing games. Make sure you get good sleep at night. These tips can help: Try not to take naps during the day. Keep your bedroom dark and cool. Do not exercise in the few hours before you go to bed. Do not have foods or drinks with caffeine at night. General instructions Take medicines only as told. Your provider may tell you to avoid taking medicines that can affect thinking. These include some medicines for pain or sleeping. Work with your provider to find out: What things you need help with. What your safety needs are. Where to  find more information [employer] on Aging: [email or web address] Contact a health care provider if: You have any new symptoms. Get help right away if: You have new confusion or your confusion gets worse. You act in ways that put you or your family in danger. This information is not intended to replace advice given to you by your health care provider. Make sure you discuss any questions you have with your health care provider. Document Revised: 01/28/2023 Document Reviewed: 01/28/2023 Elsevier Patient Education  2024 [employer].

## 2023-09-10 ENCOUNTER — Encounter: Payer: Self-pay | Admitting: Neurology

## 2023-09-11 NOTE — Telephone Encounter (Signed)
-----   Message from Providence Dohmeier sent at 09/10/2023  5:56 PM EST ----- Uncontrolled DM with high Hba1c and high non-fasting glucose level.

## 2023-09-15 LAB — APOE ALZHEIMER'S RISK

## 2023-09-15 LAB — VITAMIN B12: Vitamin B-12: 770 pg/mL (ref 232–1245)

## 2023-09-15 LAB — COMPREHENSIVE METABOLIC PANEL
ALT: 15 [IU]/L (ref 0–32)
AST: 20 [IU]/L (ref 0–40)
Albumin: 4.2 g/dL (ref 3.8–4.8)
Alkaline Phosphatase: 64 [IU]/L (ref 44–121)
BUN/Creatinine Ratio: 15 (ref 12–28)
BUN: 17 mg/dL (ref 8–27)
Bilirubin Total: 0.6 mg/dL (ref 0.0–1.2)
CO2: 22 mmol/L (ref 20–29)
Calcium: 9.3 mg/dL (ref 8.7–10.3)
Chloride: 103 mmol/L (ref 96–106)
Creatinine, Ser: 1.12 mg/dL — ABNORMAL HIGH (ref 0.57–1.00)
Globulin, Total: 2.2 g/dL (ref 1.5–4.5)
Glucose: 272 mg/dL — ABNORMAL HIGH (ref 70–99)
Potassium: 4.4 mmol/L (ref 3.5–5.2)
Sodium: 138 mmol/L (ref 134–144)
Total Protein: 6.4 g/dL (ref 6.0–8.5)
eGFR: 50 mL/min/{1.73_m2} — ABNORMAL LOW (ref >59)

## 2023-09-15 LAB — ATN PROFILE
A -- Beta-amyloid 42/40 Ratio: 0.101 — ABNORMAL LOW (ref >0.102)
Beta-amyloid 40: 249.58 pg/mL
Beta-amyloid 42: 25.19 pg/mL
N -- NfL, Plasma: 6.44 pg/mL (ref 0.00–7.64)
T -- p-tau181: 2.58 pg/mL — ABNORMAL HIGH (ref 0.00–0.97)

## 2023-09-15 LAB — HOMOCYSTEINE: Homocysteine: 14.7 umol/L (ref 0.0–19.2)

## 2023-09-15 LAB — CBC WITH DIFFERENTIAL/PLATELET
Basophils Absolute: 0 10*3/uL (ref 0.0–0.2)
Basos: 1 %
EOS (ABSOLUTE): 0.2 10*3/uL (ref 0.0–0.4)
Eos: 4 %
Hematocrit: 39.8 % (ref 34.0–46.6)
Hemoglobin: 13.1 g/dL (ref 11.1–15.9)
Immature Grans (Abs): 0 10*3/uL (ref 0.0–0.1)
Immature Granulocytes: 0 %
Lymphocytes Absolute: 1.5 10*3/uL (ref 0.7–3.1)
Lymphs: 30 %
MCH: 30.4 pg (ref 26.6–33.0)
MCHC: 32.9 g/dL (ref 31.5–35.7)
MCV: 92 fL (ref 79–97)
Monocytes Absolute: 0.4 10*3/uL (ref 0.1–0.9)
Monocytes: 7 %
Neutrophils Absolute: 2.9 10*3/uL (ref 1.4–7.0)
Neutrophils: 58 %
Platelets: 165 10*3/uL (ref 150–450)
RBC: 4.31 x10E6/uL (ref 3.77–5.28)
RDW: 11.1 % — ABNORMAL LOW (ref 11.7–15.4)
WBC: 5 10*3/uL (ref 3.4–10.8)

## 2023-09-15 LAB — SEDIMENTATION RATE: Sed Rate: 2 mm/h (ref 0–40)

## 2023-09-15 LAB — TSH+FREE T4
Free T4: 1.14 ng/dL (ref 0.82–1.77)
TSH: 1.81 u[IU]/mL (ref 0.450–4.500)

## 2023-09-15 LAB — PROTEIN ELECTROPHORESIS, SERUM
A/G Ratio: 1.4 (ref 0.7–1.7)
Albumin ELP: 3.7 g/dL (ref 2.9–4.4)
Alpha 1: 0.2 g/dL (ref 0.0–0.4)
Alpha 2: 0.8 g/dL (ref 0.4–1.0)
Beta: 1 g/dL (ref 0.7–1.3)
Gamma Globulin: 0.6 g/dL (ref 0.4–1.8)
Globulin, Total: 2.7 g/dL (ref 2.2–3.9)

## 2023-09-15 LAB — HEMOGLOBIN A1C
Est. average glucose Bld gHb Est-mCnc: 200 mg/dL
Hgb A1c MFr Bld: 8.6 % — ABNORMAL HIGH (ref 4.8–5.6)

## 2023-09-15 LAB — ANA W/REFLEX: Anti Nuclear Antibody (ANA): NEGATIVE

## 2023-09-16 ENCOUNTER — Telehealth: Payer: Self-pay | Admitting: Neurology

## 2023-09-16 NOTE — Telephone Encounter (Signed)
Ethlyn Gallery: [phone] exp. 09/16/23-11/15/23 sent to GI [phone]

## 2023-09-16 NOTE — Telephone Encounter (Signed)
Called and spoke with the patient and her husband. Was able to provide the lab results. They verbalized understanding. Pt is awaiting the MRI to be scheduled. She has not started the aricept yet but will check with the pharmacy about whether they have that ready. They were appreciative for the information.

## 2023-10-14 ENCOUNTER — Other Ambulatory Visit: Payer: Self-pay | Admitting: Neurology

## 2023-10-15 MED ORDER — MEMANTINE HCL 28 X 5 MG & 21 X 10 MG PO TABS: ORAL_TABLET | ORAL | 0 refills | Status: DC

## 2023-10-15 NOTE — Addendum Note (Signed)
 Addended by: Judi Cong on: 10/15/2023 12:36 PM   Modules accepted: Orders

## 2023-10-27 ENCOUNTER — Ambulatory Visit
Admission: RE | Admit: 2023-10-27 | Discharge: 2023-10-27 | Disposition: A | Discharge status: Home / Self Care | Payer: Medicare PPO | Source: Ambulatory Visit | Attending: Neurology | Admitting: Neurology

## 2023-10-27 DIAGNOSIS — I10 Essential (primary) hypertension: Secondary | ICD-10-CM | POA: Diagnosis not present

## 2023-10-27 DIAGNOSIS — R413 Other amnesia: Secondary | ICD-10-CM | POA: Diagnosis not present

## 2023-10-27 DIAGNOSIS — F02B18 Dementia in other diseases classified elsewhere, moderate, with other behavioral disturbance: Secondary | ICD-10-CM | POA: Diagnosis not present

## 2023-10-27 DIAGNOSIS — E119 Type 2 diabetes mellitus without complications: Secondary | ICD-10-CM | POA: Diagnosis not present

## 2023-10-27 DIAGNOSIS — G319 Degenerative disease of nervous system, unspecified: Secondary | ICD-10-CM | POA: Diagnosis not present

## 2023-10-27 IMAGING — MR MR ABDOMEN WO/W CM
10 of 18 series · 26 of 48 positions shown · IV contrast (multihance)
Comparison: CT on 07/02/2021

CLINICAL DATA: Unexplained weight loss. Pancreatic lesion on recent
CT. Previous Whipple procedure for benign pancreatic tumor, and
right nephrectomy for renal cell carcinoma.

EXAM:
MRI ABDOMEN WITHOUT AND WITH CONTRAST
TECHNIQUE: Multiplanar multisequence MR imaging of the abdomen was performed
both before and after the administration of intravenous contrast.
CONTRAST:  8mL MULTIHANCE GADOBENATE DIMEGLUMINE 529 MG/ML IV SOLN

[Series 3: T2 · coronal · 5.5mm · 1.41mm/px · 2 of 33 slices shown (1 of 3)]
[im 1/33]
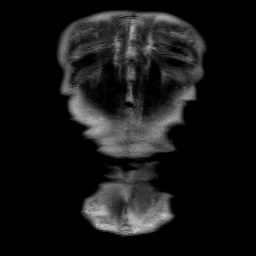
[im 33/33]
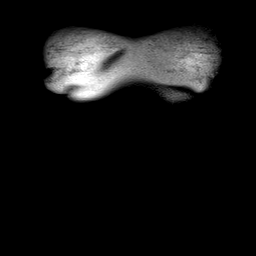

[Series 4: T2 · axial · 5.5mm · 1.41mm/px · z∈[-114,+113]mm · 2 of 34 slices shown (2 of 3)]
[im 1/34]
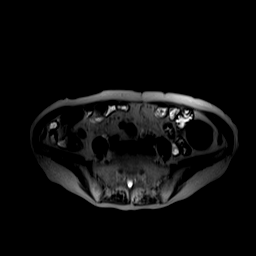
[im 34/34]
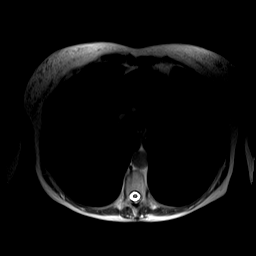

[Series 5: axial in out · axial · 6.5mm · 0.74mm/px · z∈[-125,+141]mm · 4 of 72 slices shown]
[im 1/72]
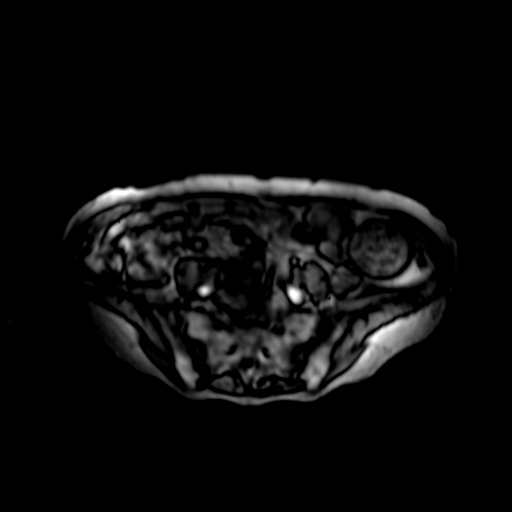
[im 24/72]
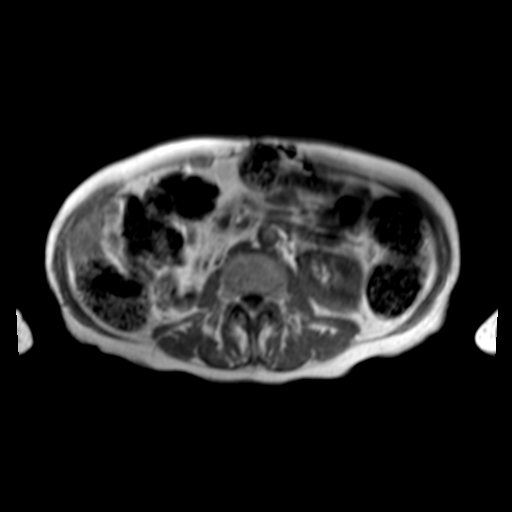
[im 48/72]
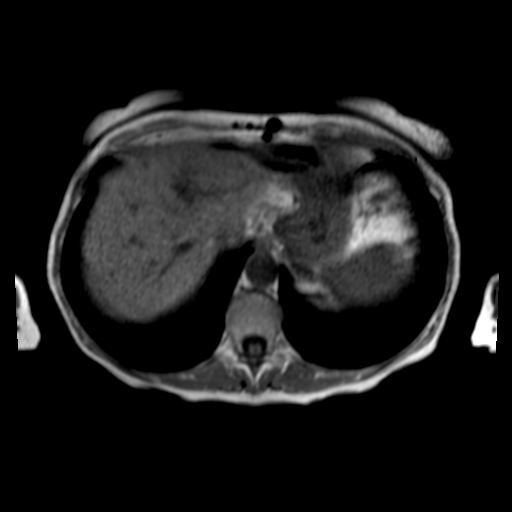
[im 72/72]
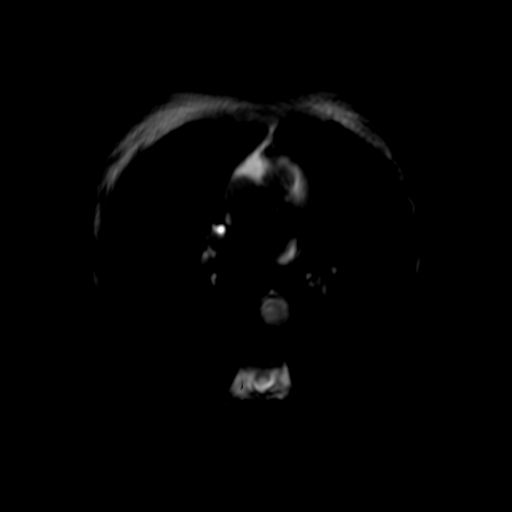

[Series 6: T2 · axial · 6.5mm · 0.74mm/px · z∈[-113,+129]mm · 2 of 32 slices shown (3 of 3)]
[im 1/32]
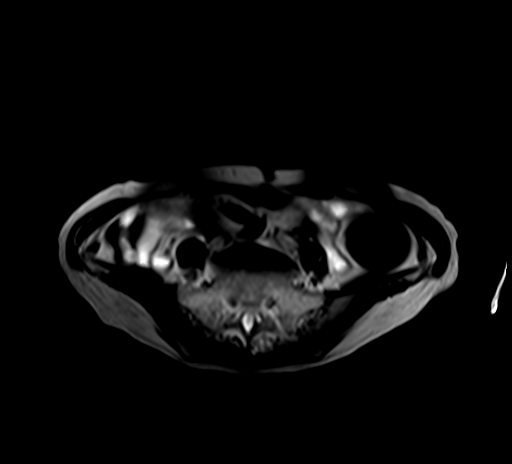
[im 32/32]
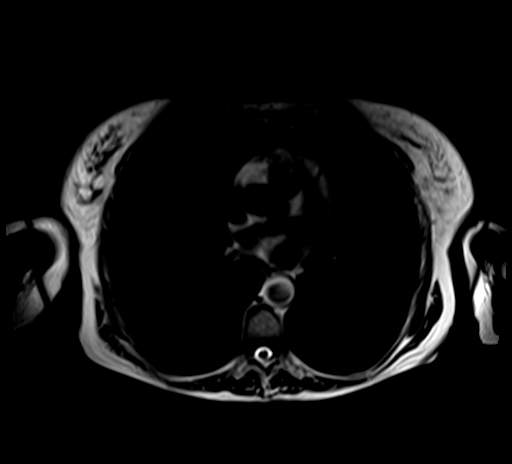

[Series 10: [id number] · axial · 6.5mm · 1.98mm/px · z∈[-109,+125]mm · 4 of 93 slices shown]
[im 1/93]
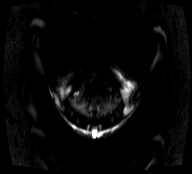
[im 31/93]
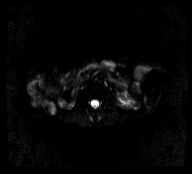
[im 62/93]
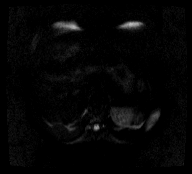
[im 93/93]
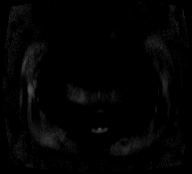

[Series 11: [id number] · axial · 6.5mm · 1.98mm/px · 1 of 31 slices shown]
[im 1/31]
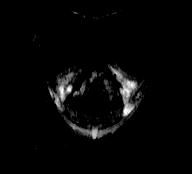

[Series 12: T1 dynamic · axial · non-contrast · 2.5mm · 0.74mm/px · z∈[-101,+116]mm · 3 of 88 slices shown]
[im 1/88]
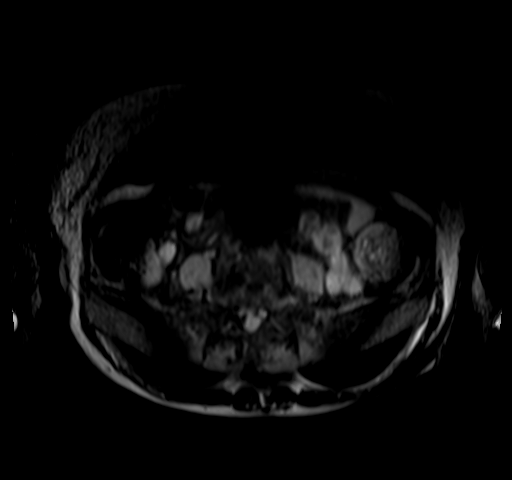
[im 44/88]
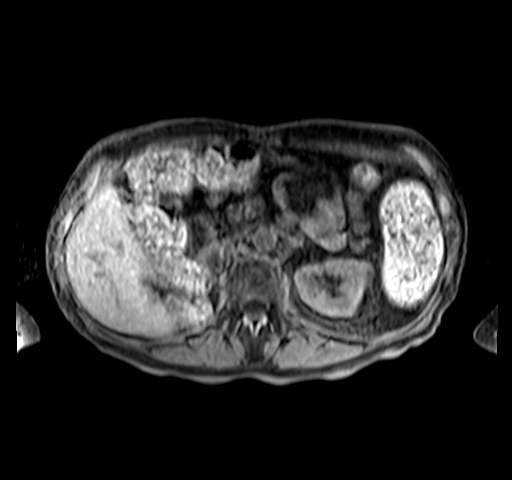
[im 88/88]
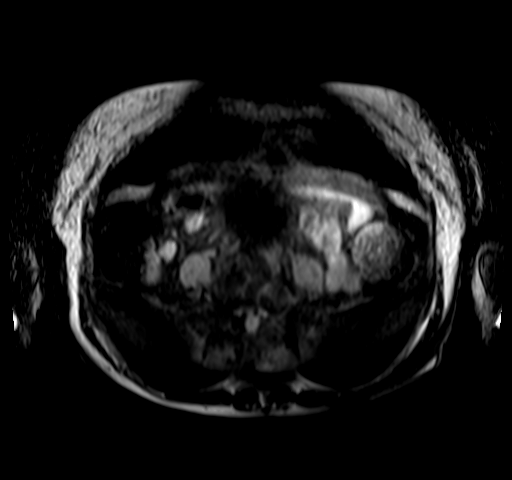

[Series 14: post 25 sec · axial · 2.5mm · 0.74mm/px · z∈[-101,+116]mm · 3 of 88 slices shown]
[im 1/88]
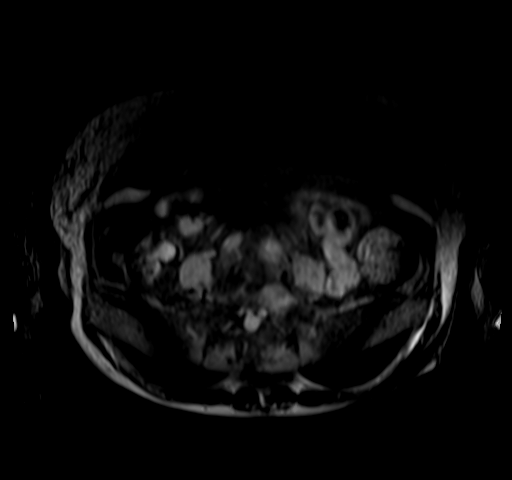
[im 44/88]
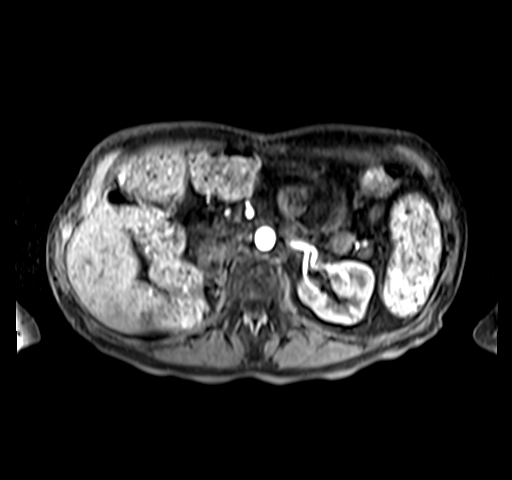
[im 88/88]
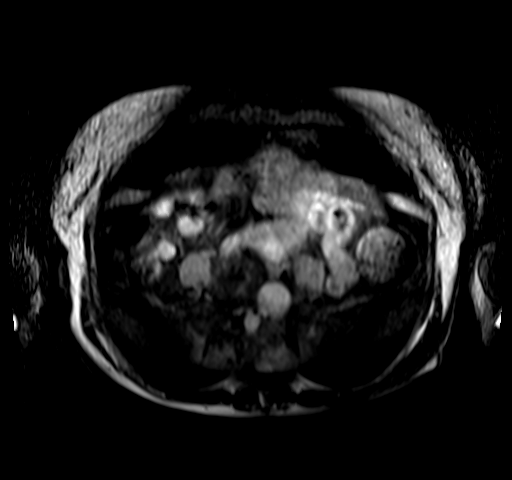

[Series 15: post 25 sec_sub · axial · 2.5mm · 0.74mm/px · z∈[-101,+116]mm · 3 of 88 slices shown]
[im 1/88]
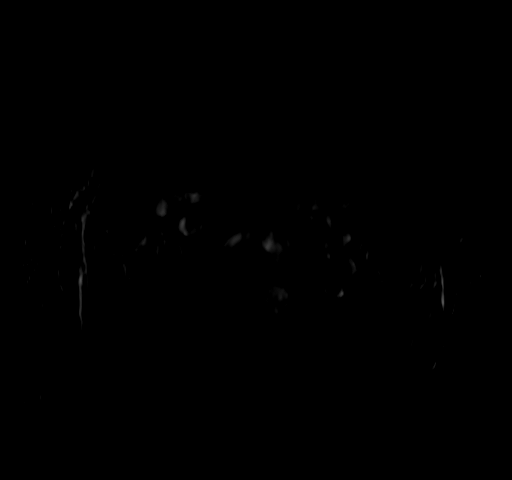
[im 44/88]
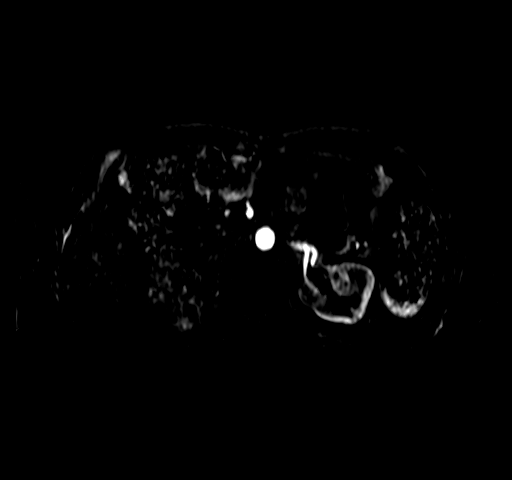
[im 88/88]
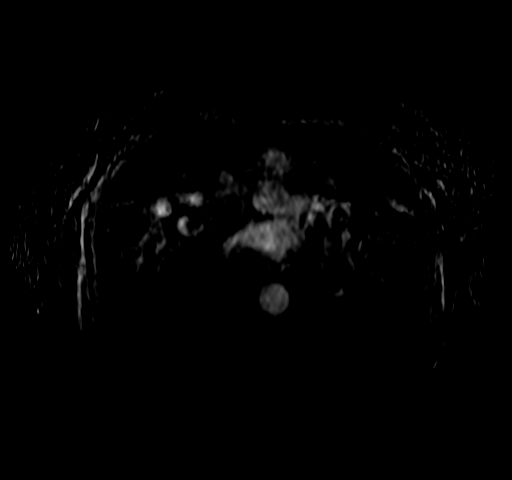

[Series 16: post 45 sec · axial · 2.5mm · 0.74mm/px · z∈[-101,+6]mm · 2 of 88 slices shown]
[im 1/88]
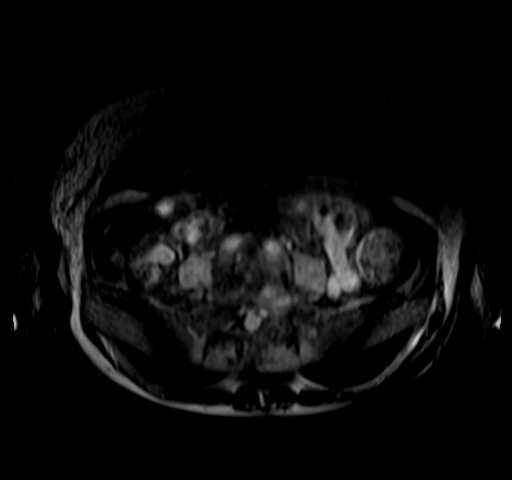
[im 44/88]
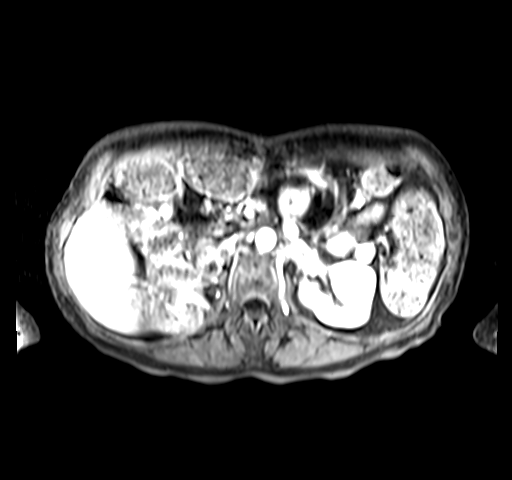

[26 of 48 positions shown; findings below may reference images not displayed]

FINDINGS: Lower chest: No acute findings.

Hepatobiliary: No hepatic masses identified. Prior cholecystectomy.
No evidence of biliary obstruction.

Pancreas: Postop changes from Whipple procedure again noted. A
simple appearing cystic lesion is seen in the pancreatic body which
measures 1.2 x 0.7 cm on image [DATE]. No other pancreatic lesions
identified. No evidence of pancreatic ductal dilatation. Diffuse
fatty replacement of pancreas is noted. No evidence of acute
pancreatitis.

Spleen:  Within normal limits in size and appearance.

Adrenals/Urinary Tract: Prior right nephrectomy. Tiny sub-cm left
renal cyst noted. No evidence of renal mass or hydronephrosis.

Stomach/Bowel: Visualized portion unremarkable.

Vascular/Lymphatic: No pathologically enlarged lymph nodes
identified. No acute vascular findings.

Other: Rectus diastasis is noted with abdominal wall contour bulge,
but no evidence of true hernia.

Musculoskeletal:  No suspicious bone lesions identified.
IMPRESSION: 1.2 cm simple appearing cystic lesion in the pancreatic body.
Differential diagnosis includes pseudocyst and indolent cystic
neoplasm such as a side-branch IPMN. Recommend continued follow-up
by MRI in 2 years. This recommendation follows ACR consensus
guidelines: Management of Incidental Pancreatic Cysts: A White Paper
of the ACR Incidental Findings Committee. [HOSPITAL]

## 2023-10-27 MED ORDER — GADOPICLENOL 0.5 MMOL/ML IV SOLN
5.0000 mL | Freq: Once | INTRAVENOUS | Status: AC | PRN
  Administered 2023-10-27: 5 mL via INTRAVENOUS

## 2023-11-19 ENCOUNTER — Telehealth: Payer: Self-pay

## 2023-11-19 NOTE — Telephone Encounter (Signed)
-----   Message from Ambrose Dohmeier sent at 10/28/2023  6:15 PM EDT ----- 1. Mild to moderate generalized cortical atrophy that is more pronounced in the medial temporal lobes.  Though nonspecific, this pattern is often seen with Alzheimer's disease. 2. Scattered T2/FLAIR hyperintense foci in the cerebral hemispheres consistent with mild chronic microvascular ischemic change.  None of the foci appear to be acute. 3. No acute findings.  Normal enhancement pattern. No strokes, trauma  or tumor.

## 2023-11-19 NOTE — Telephone Encounter (Signed)
 I called patient.  I had an extended call with her lasting greater than 14 minutes. I advised her of her MRI brain results.  Patient verbalized understanding.  However, she had multiple questions on her memantine.  I discussed this in great detail.  Patient asked her husband to come to the phone and I also discussed her MRI brain and memantine instructions with him.  Patient and husband verbalized understanding of memantine and MRI results. Once patient has progressed to the memantine (5mg ) 10mg  BID I advised him to call our office.  We can switch her prescription to memantine 10 mg twice daily tablets, if desired.

## 2023-11-24 ENCOUNTER — Other Ambulatory Visit: Payer: Self-pay

## 2023-11-24 MED ORDER — MEMANTINE HCL 10 MG PO TABS: 10.0000 mg | ORAL_TABLET | Freq: Two times a day (BID) | ORAL | 1 refills | Status: DC

## 2023-12-22 DIAGNOSIS — H52203 Unspecified astigmatism, bilateral: Secondary | ICD-10-CM | POA: Diagnosis not present

## 2023-12-22 DIAGNOSIS — Z961 Presence of intraocular lens: Secondary | ICD-10-CM | POA: Diagnosis not present

## 2023-12-22 DIAGNOSIS — H18513 Endothelial corneal dystrophy, bilateral: Secondary | ICD-10-CM | POA: Diagnosis not present

## 2023-12-26 ENCOUNTER — Encounter: Payer: Self-pay | Admitting: Neurology

## 2023-12-30 NOTE — Telephone Encounter (Signed)
 Spoke w/Pt who stated she is currently taking memantine  10 mg BID. Inquired about questions she has regarding her test results which were discussed on 11/19/23. Pt stated her husband is the one who has questions and he is out of town this week. Pt stated she will have him call our office next week when he returns.

## 2023-12-30 NOTE — Telephone Encounter (Signed)
 Spouse has called with questions for RN

## 2023-12-30 NOTE — Telephone Encounter (Signed)
 Addend to previous 12/30/23 phone note: Pt spouse reports Pt is currently taking memantine  10 mg BID and is tolerating it well.

## 2023-12-30 NOTE — Telephone Encounter (Signed)
 Pt spouse cld in ref to results and medication, stated Pt wanted to discuss test results. Informed spouse when spoke w/Pt earlier, Pt stated spouse wanted to discuss results. Spouse stated Pt is confused and is having difficulty accepting she can no longer drive. Also discussed the conversation they had with Starling Eck, Charity fundraiser. Spouse recalled that conversation but stated Pt wants to talk with Dr. Albertina Hugger and they are requesting an earlier appt than 03/08/24. Informed spouse currently no appts available before that date but the Pt will be added to the cancellation wait list and to monitor MyChart as appts are first come/first serve. Spouse in agreement and voiced understanding. Also stated Pt has an upcoming appt w/PCP and hopes PCP will reinforce w/Pt the need for not driving as well as discuss test results w/Pt. Informed spouse of helps for caregivers on [email or web address] and [facility] has good resources for caregiver support. Spouse voiced thankful for the info and thankful for taking the call.

## 2024-01-15 DIAGNOSIS — I10 Essential (primary) hypertension: Secondary | ICD-10-CM | POA: Diagnosis not present

## 2024-01-15 DIAGNOSIS — R4189 Other symptoms and signs involving cognitive functions and awareness: Secondary | ICD-10-CM | POA: Diagnosis not present

## 2024-01-15 DIAGNOSIS — E1169 Type 2 diabetes mellitus with other specified complication: Secondary | ICD-10-CM | POA: Diagnosis not present

## 2024-01-15 DIAGNOSIS — Z681 Body mass index (BMI) 19 or less, adult: Secondary | ICD-10-CM | POA: Diagnosis not present

## 2024-01-26 ENCOUNTER — Ambulatory Visit: Admitting: Neurology

## 2024-01-26 VITALS — BP 134/78 | HR 62 | Ht 62.0 in | Wt 101.0 lb

## 2024-01-26 DIAGNOSIS — G309 Alzheimer's disease, unspecified: Secondary | ICD-10-CM | POA: Diagnosis not present

## 2024-01-26 MED ORDER — DONEPEZIL HCL 5 MG PO TABS: 5.0000 mg | ORAL_TABLET | Freq: Every day | ORAL | 5 refills | Status: DC

## 2024-01-26 MED ORDER — LEQEMBI 200 MG/2ML IV SOLN: INTRAVENOUS | 5 refills | Status: DC

## 2024-01-26 NOTE — Progress Notes (Signed)
 I have reviewed the patient's medical history in detail and updated the computerized patient record.          Provider:  Neomia Banner, MD  Primary Care Physician:  Jimmey Mould, MD [address] [place] [address]     Referring Provider: Jimmey Mould, Md [address] [place],  [place] [address]          Chief Complaint according to patient   Patient presents with:                HISTORY OF PRESENT ILLNESS:  Jennifer Wall is a 78 y.o. female patient who is here for revisit 01/26/2024 for  AD positive bio markers, and moderate Neurocognitive  disorder,  MMSE 17.  She has Fuch's dystrophy, and has not needed an new glasses, [facility]. She has been losing interest in  reading but feels that's because eye strain.  There is a concern about the  visio-spatial  processing.  She had a whipple for pancreatic tumor ! She had a nephrectomy for kidney cancer !    Her higher glucose is a concern and that was recently addressed by Dr Avanell Bob.        01/26/2024    9:35 AM 09/08/2023    2:27 PM  MMSE - Mini Mental State Exam  Orientation to time 1 2  Orientation to Place 4 4  Registration 3 3  Attention/ Calculation WORLD 5 1  Recall 1 3  Language- name 2 objects 2 2  Language- repeat 1 1  Language- follow 3 step command 3 3  Language- read & follow direction 1 1  Write a sentence 1 1  Copy design 0 0  Total score 22 21     Fam Hx:    Social HX ; She an Event organiser for 2 decades.     Jennifer Wall is a 78 y.o. female and seen here upon referral from Dr. Jimmey Mould for a Consultation/ Evaluation of a memory disorder. The patient is pleasant but slowed, it takes a while to answer any question and any answer is vague, small talk, hesitant speech.  Looking for work.    The patient was not able to perform on the [facility] test , was changed to MMSE and reached here only 21/ 30 points tearful, agitated, anxious while  trying MOCA.    The patient had a  back surgery, had whipple surgery /2015 pancreatitis, nephrectomy 2018, has DM and lost weight.   Family history: her  father died at age 67 , she was 48 at the time, he had headaches and died of HTN.  Mother died at age 33 of cancer ,metastatic to the liver . Siblings: one sister age 4 , 3 years junior, with breast cancer.     Social history,  married with 2 adult children, 5 grandchildren.  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  After high school  went to college at [employer] ,  teaching elementary school.  These facts tok a long time to extract. 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  Never been a smoker,  drinking wine some nights, but no excesses.   This patient reports onset of memory loss  over a period of 24 months,  trouble to remember names ,  dates and appointments, looking for words. Doesn't do any banking, can't remember pin  number. Husband handles all finances, for several years.  The couple  has social activities, but her husband nodded that she is not going anymore neither to  book club nor bridge club.  She has developed difficulties to play bridge.  Still driving and stated she has not gotten lost.   Established routines daily life, ADL, she gets up at 7.30, breakfast together, reads a newspaper,  She doesn't use the treadmill. She likes outdoor activity but only iif its warm and not rainy..  The cold owns a dog that her husband walks.     Review of Systems: Out of a complete 14 system review, the patient complains of only the following symptoms, and all other reviewed systems are negative.:   Social History   Socioeconomic History   Marital status: Married    Spouse name: Not on file   Number of children: 2   Years of education: Not on file   Highest education level: Not on file  Occupational History   Not on file   Tobacco Use   Smoking status: Never   Smokeless tobacco: Never  Vaping Use   Vaping status: Never Used  Substance and Sexual Activity   Alcohol use: Yes    Alcohol/week: 2.0 standard drinks of alcohol    Types: 2 Glasses of wine per week    Comment: occasional   Drug use: Not Currently   Sexual activity: Yes  Other Topics Concern   Not on file  Social History Narrative   Right handed   Wear glasses    1 cup coffee per day   Social Drivers of Health   Financial Resource Strain: Not on file  Food Insecurity: Not on file  Transportation Needs: Not on file  Physical Activity: Not on file  Stress: Not on file  Social Connections: Not on file    Family History  Problem Relation Age of Onset   Cancer Mother    Hypertension Father    Breast cancer Sister    Hypertension Sister     Past Medical History:  Diagnosis Date   Basal cell carcinoma 04/17/2015   Bilateral renal cysts    incidental finding on CT 06/ 2016   Diabetes mellitus type 2, noninsulin dependent (HCC) 09/06/2016   Duplicated ureter, right    per urologist-- dr Grady Lawman   Essential hypertension 03/23/2015   GERD (gastroesophageal reflux disease)    mild- controls with diet   Hiatal hernia    History of acute pancreatitis    01-14-2006  ([facility])  idiopathic-  resolved   History of atrial fibrillation without current medication currently 09-18-2015 pt denies S & S   post op 04-14-2015 whipple procedure (per pt had work-up done by [facility] cardiologist- pt was released from cardiology care -- no meds or asa)   History of basal cell carcinoma excision    multiple skin removal   History of colon polyps    hyperplastic   History of malignant neuroendocrine neoplasm followed by oncologist-- dr Mason Sole at [facility]--- per last note 09-19-2016 no recurrence   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  s/p  whipple (T3 N0 M0, Grade 1) negative margins and nodes   History of melanoma excision    1989 left thigh   History  of pancreatic surgery    08/ 2016  s/p  whipple for neuroendocrine neoplasm tumor   History of renal cell cancer UROLOGIST-  DR OTTELIN   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)   s/p  fulgeration 03/ 2017, 10/ 2017 (no radiation or chemo)   History of transitional cell carcinoma of kidney 09/06/2016   Hyperlipidemia    Hypertension    Melanoma (HCC) 04/17/2015   Postoperative atrial fibrillation (HCC) 05/22/2015   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   Supraventricular tachycardia (HCC)    diagnosed at [facility] by monitor 2017   Supraventricular tachycardia, paroxysmal (HCC) 06/12/2015   Type 2 diabetes mellitus (HCC)    Ureteral cancer, 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   Ventral hernia     Past Surgical History:  Procedure Laterality Date   CATARACT EXTRACTION W/ INTRAOCULAR LENS  IMPLANT, BILATERAL  2010   COLONOSCOPY  last one 2015   CYSTOSCOPY WITH RETROGRADE PYELOGRAM, URETEROSCOPY AND STENT PLACEMENT Right 09/22/2015   Procedure: CYSTOSCOPY WITH RIGHT RETROGRADE PYELOGRAM, URETEROSCOPY AND STENT PLACEMENT, BIOPSY;  Surgeon: Mark Ottelin, MD;  Location[facility];  Service: Urology;  Laterality: Right;   CYSTOSCOPY WITH RETROGRADE PYELOGRAM, URETEROSCOPY AND STENT PLACEMENT Right 11/13/2015   Procedure: CYSTOSCOPY WITH RIGHT RETROGRADE PYELOGRAM, URETEROSCOPY BIOPSY, FULGURATION  AND STENT ;  Surgeon: Mark Ottelin, MD;  Location: [facility] [place];  Service: Urology;  Laterality: Right;   CYSTOSCOPY WITH URETEROSCOPY AND STENT PLACEMENT Right 06/17/2016   Procedure: CYSTOSCOPY , URETEROSCOPY  WITH  FULGUTATION;  Surgeon: Mark Ottelin, MD;  Location: [facility];  Service: Urology;  Laterality: Right;   CYSTOSCOPY/RETROGRADE/URETEROSCOPY Right 04/14/2017   Procedure: CYSTOSCOPY/RETROGRADE/URETEROSCOPY/ FULGURATION OF UPPER POLE TUMOR;  Surgeon: Ottelin, Mark, MD;  Location: [facility];  Service:  Urology;  Laterality: Right;   DIAGNOSTIC LAPAROSCOPY  04/14/2015   per office note from [facility] 07/2015   EUS N/A 02/22/2015   Procedure: UPPER ENDOSCOPIC ULTRASOUND (EUS) RADIAL;  Surgeon: Evangeline Hilts, MD;  Location: [facility];  Service: Endoscopy;  Laterality: N/A;   KIDNEY SURGERY Right    right kindey removed   LUMBAR DISC SURGERY  2000   L4 - L5   MELANOMA EXCISION  1989   left thigh   PANCREATICODUODENECTOMY  04-14-2015   at [facility]   malignant neuroendocrine pancreatic tumor   PULLEY RELEASE RIGHT THUMB  02-01-2005     Current Outpatient Medications on File Prior to Visit  Medication Sig Dispense Refill   acetaminophen  (TYLENOL ) 500 MG tablet Take 1,000 mg by mouth every 6 (six) hours as needed for moderate pain or headache.     alendronate (FOSAMAX) 70 MG tablet Take 1 mg by mouth once a week.     apixaban  (ELIQUIS ) 5 MG TABS tablet Take 1 tablet (5 mg total) by mouth 2 (two) times daily. 180 tablet 1   citalopram (CELEXA) 20 MG tablet Take 20 mg by mouth every morning.     hydrochlorothiazide (MICROZIDE) 12.5 MG capsule Take 12.5 mg by mouth daily.     lisinopril  (ZESTRIL ) 10 MG tablet Take 20 mg by mouth daily.     memantine  (NAMENDA ) 10 MG tablet Take 1 tablet (10 mg total) by mouth 2 (two) times daily. 180 tablet 1   metoprolol  succinate (TOPROL  XL) 50 MG 24 hr tablet Take 0.5 tablets (25 mg total) by mouth daily. Take with or immediately following a meal. 45 tablet 3   pantoprazole (PROTONIX) 40 MG tablet Take 40 mg by mouth daily.     repaglinide (PRANDIN) 2 MG tablet Take 1 mg by mouth 2 (two) times daily before a meal.     simvastatin (ZOCOR) 40 MG tablet Take 40 mg  by mouth at bedtime.     triamcinolone (NASACORT) 55 MCG/ACT AERO nasal inhaler Place 1 spray into the nose daily as needed.     No current facility-administered medications on file prior to visit.    Allergies  Allergen Reactions   Nitrofurantoin Other (See Comments)    diarrhea   Fenofibrate  Micronized Other (See Comments)    Leg pain.    Metformin Other (See Comments)    Stomach pain, pancreatitis   Metformin And Related Other (See Comments)    Stomach pain   Tricor [Fenofibrate] Other (See Comments)    Leg pain.    Erythromycin Rash    + turn red     DIAGNOSTIC DATA (LABS, IMAGING, TESTING) - I reviewed patient records, labs, notes, testing and imaging myself where available.  Lab Results  Component Value Date   WBC 5.0 09/08/2023   HGB 13.1 09/08/2023   HCT 39.8 09/08/2023   MCV 92 09/08/2023   PLT 165 09/08/2023      Component Value Date/Time   NA 138 09/08/2023 1532   K 4.4 09/08/2023 1532   CL 103 09/08/2023 1532   CO2 22 09/08/2023 1532   GLUCOSE 272 (H) 09/08/2023 1532   GLUCOSE 193 (H) 04/14/2017 1002   BUN 17 09/08/2023 1532   CREATININE 1.12 (H) 09/08/2023 1532   CALCIUM 9.3 09/08/2023 1532   PROT 6.4 09/08/2023 1532   ALBUMIN 4.2 09/08/2023 1532   AST 20 09/08/2023 1532   ALT 15 09/08/2023 1532   ALKPHOS 64 09/08/2023 1532   BILITOT 0.6 09/08/2023 1532   GFRNONAA 60 (L) 10/30/2016 0049   GFRAA >60 10/30/2016 0049   No results found for: "CHOL", "HDL", "LDLCALC", "LDLDIRECT", "TRIG", "CHOLHDL" Lab Results  Component Value Date   HGBA1C 8.6 (H) 09/08/2023   Lab Results  Component Value Date   VITAMINB12 770 09/08/2023   Lab Results  Component Value Date   TSH 1.810 09/08/2023    PHYSICAL EXAM:  Today's Vitals   01/26/24 0927  BP: 134/78  Pulse: 62  Weight: 101 lb (45.8 kg)  Height: 5\' 2"  (1.575 m)   Body mass index is 18.47 kg/m.   Wt Readings from Last 3 Encounters:  01/26/24 101 lb (45.8 kg)  09/08/23 104 lb 9.6 oz (47.4 kg)  06/18/23 102 lb 12.8 oz (46.6 kg)     Ht Readings from Last 3 Encounters:  01/26/24 5\' 2"  (1.575 m)  09/08/23 5\' 2"  (1.575 m)  06/18/23 5\' 2"  (1.575 m)      General: The patient is awake, alert and appears not in acute distress. General: The patient is awake, alert and appears not in acute  distress.  The patient is well groomed. Head: Normocephalic, atraumatic.  Neck is supple. No Goiter.   Neck circumference:12.5"  Cardiovascular:  Regular rate and palpable peripheral pulse:  Respiratory: clear to auscultation.  Mallampati 1, Skin:  Without  evidence of edema, or rash Trunk:  normal posture.     Neurologic exam : The patient is awake and alert, oriented to place and time.   Memory subjective  described as impaired.  There is a reduced  attention span & concentration ability.  Speech is fluent without  dysarthria, dysphonia or aphasia.  Mood and affect are appropriate.   Cranial nerves: Pupils are equal and briskly reactive to light. Funduscopic exam without  evidence of pallor or edema. Extraocular movements  in vertical and horizontal planes intact and without nystagmus. Visual fields by finger perimetry  are intact. Hearing to finger rub intact.  Facial sensation intact to fine touch. Facial motor strength is symmetric and tongue and uvula move midline.   Motor exam:  Coarsely elevated tone and reduced  muscle bulk and symmetric normal strength in all extremities. Grip Strength equally reduced    Proximal strength of shoulder muscles and hip flexors was reduced. Muscle mass is low.    Sensory: normal.   Coordination: Rapid alternating movements in the fingers/hands were normal.  Finger-to-nose maneuver was tested and showed no evidence of ataxia, dysmetria or tremor.   Gait and station: Patient walked without assistive device .  Core Strength within normal limits. Stance is stable and of normal base.  Tandem gait is not tested , turns with 4 Steps are unfragmented.  Deep tendon reflexes: in the  upper and lower extremities are symmetric and without Clonus.      ASSESSMENT AND PLAN:  MRI reviewed : This MRI of the brain with and without contrast shows the following: Mild to moderate generalized cortical atrophy that is more pronounced in the medial temporal  lobes.  Though nonspecific, this pattern is often seen with Alzheimer's disease. Scattered T2/FLAIR hyperintense foci in the cerebral hemispheres consistent with mild chronic microvascular ischemic change.  None of the foci appear to be acute. No acute findings.  Normal enhancement pattern.     INTERPRETING PHYSICIAN:  Richard A. Godwin Lat, MD, PhD, FAAN Certified in  Neuroimaging by [employer]     78 y.o. 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  Healthy lifestyle is a supportive step.  Reading, exercising,     1) Alzheimer's disease  , moderate degree of dysfunction. Dementia.   MMSE with serial 7 th was 17 points, with WORLD spelling it was 22 points.   2)  Has positive ATNs, and normal ( atrophy but no bleeds)  brain MRI   3)  has been interested in [place] - [facility] after PET scan and start treatment in Summer this year     I would like to thank Jimmey Mould, MD and Jimmey Mould, Md [address] [place],  [place] [address] for allowing me to meet with and to take care of this pleasant patient.    After spending a total time of  40  minutes face to face and additional time for physical and neurologic examination, review of laboratory studies,  personal review of imaging studies, reports and results of other testing and review of referral information / records as far as provided in visit,   Electronically signed by: Neomia Banner, MD 01/26/2024 9:59 AM  [facility] Board certified by [employer] Medicine and Diplomate of the [employer]. Board certified In Neurology through the ABPN, Fellow of the [employer].

## 2024-01-26 NOTE — Patient Instructions (Signed)
 Problems With Thinking and Memory (Mild Neurocognitive Disorder): What to Know Mild neurocognitive disorder, formerly known as mild cognitive impairment, is a disorder where your memory doesn't work as well as it should. It may also affect other mental abilities like thinking, communicating, behavior, and being able to finish tasks. These problems can be noticed and measured. But they usually don't stop you from doing daily activities or living on your own. Mild neurocognitive disorder usually happens after 79 years of age. But it can also happen at younger ages. It's not as serious as major neurocognitive disorder, also known as dementia, but it may be the first sign of it. In general, the symptoms of this condition get worse over time. In rare cases, symptoms can get better. What are the causes? This condition may be caused by: Brain disorders like Alzheimer's disease, Parkinson's disease, and other conditions that slowly damage nerve cells. Diseases that affect the blood vessels in the brain and cause small strokes. Certain infections, like HIV. Traumatic brain injury. Other medical conditions, such as brain tumors, underactive thyroid  (hypothyroidism), and not having enough vitamin B12. Using certain drugs or medicines. What increases the risk? Being older than 79 years of age. Being female. Having a lower level of education. Diabetes, high blood pressure, high cholesterol, and other conditions that raise the risk for blood vessel diseases. Untreated or undertreated sleep apnea. Having a certain type of gene that can be inherited, or passed down from parent to child. Long-term health problems like heart disease, lung disease, liver disease, kidney disease, or depression. What are the signs or symptoms? Trouble remembering things. You may: Forget names, phone numbers, or details of recent events. Forget about social events and appointments. Often forget where you put your car keys or other  items. Trouble thinking and solving problems. You may have trouble with complex tasks like: Paying bills. Driving in places you don't know well. Trouble communicating. You may have trouble: Finding the right word or naming an object. Forming a sentence that makes sense. Understanding what you read or hear. Changes in your behavior or personality. When this happens, you may: Lose interest in the things you used to enjoy. Avoid being around people. Get angry more easily than usual. Act before thinking. How is this diagnosed? This condition is diagnosed based on: Your symptoms. Your health care provider may ask you and the people you spend time with, like family and friends, about your symptoms. Memory tests and other tests to check how your brain is working. Your provider may refer you to a provider called a neurologist or a mental health specialist. To try to find out the cause of your condition, your provider may: Get a detailed medical history. Ask about use of alcohol, drugs, and medicines. Do a physical exam. Order blood tests and brain imaging tests. How is this treated? Mild neurocognitive disorder that's caused by medicine use, drug use, infection, or another medical condition may get better when the cause is treated, or when medicines or drugs are stopped. If this disorder has another cause, it usually doesn't improve and may get worse. In these cases, the goal of treatment is to help you manage the symptoms. This may include: Medicines to help with memory and behavior symptoms. Talk therapy. This provides education, emotional support, memory aids, and other ways of making up for problems with mental tasks. Lifestyle changes. These may include: Getting regular exercise. Eating a healthy diet that includes omega-3 fatty acids. Doing things to challenge your thinking  and memory skills. Spending more time being with and talking to other people. Using routines like having regular  times for meals and going to bed. Follow these instructions at home: Eating and drinking  Drink more fluids as told. Eat a healthy diet that includes omega-3 fatty acids. These can be found in: Fish. Nuts. Leafy vegetables. Vegetable oils. If you drink alcohol: Limit how much you have to: 0-1 drink a day if you're female. 0-2 drinks a day if you're female. Know how much alcohol is in your drink. In the [place] one drink is one 12 oz bottle of beer (355 mL), one 5 oz glass of wine (148 mL), or one 1 oz glass of hard liquor (44 mL). Lifestyle  Get regular exercise as told by your provider. Do not smoke, vape, or use nicotine or tobacco. Use healthy ways to manage stress. If you need help managing stress, ask your provider. Keep spending time with other people. Keep your mind active by doing activities you enjoy, like reading or playing games. Make sure you get good sleep at night. These tips can help: Try not to take naps during the day. Keep your bedroom dark and cool. Do not exercise in the few hours before you go to bed. Do not have foods or drinks with caffeine at night. General instructions Take medicines only as told. Your provider may tell you to avoid taking medicines that can affect thinking. These include some medicines for pain or sleeping. Work with your provider to find out: What things you need help with. What your safety needs are. Where to find more information [employer] on Aging: [email or web address] Contact a health care provider if: You have any new symptoms. Get help right away if: You have new confusion or your confusion gets worse. You act in ways that put you or your family in danger. This information is not intended to replace advice given to you by your health care provider. Make sure you discuss any questions you have with your health care provider. Document Revised: 01/28/2023 Document Reviewed: 01/28/2023 Elsevier Patient Education  2024 Elsevier  Inc.Alzheimer's Disease Caregiver Guide Alzheimer's disease is a condition that makes a person: Forget things. Act differently. Have trouble paying attention and doing simple tasks. Have trouble talking and responding to your questions. These things get worse with time. The tips below can help you care for the person. How to help manage lifestyle changes Tips to help with symptoms Be calm and patient. Give simple, short answers to questions. Long answers can confuse the person. Avoid correcting the person in a negative way. Do not argue with the person. This may make the person more upset. Try not to take things personally, even if the person forgets your name. Tips to lessen frustration Use simple words and a calm voice. Only give one direction at a time. Do daily tasks, like bathing and dressing, when the person is at their best. Also make any appointments for these times. Take your time. Simple tasks may take longer. Allow plenty of time to get tasks done. Limit choices for the person. Too many choices can be stressful. Involve the person in what you're doing. Keep things organized: Keep a daily routine. Organize medicines in a pillbox for each day of the week. Keep a calendar in a central place. Use it to remind the person of health care visits or other activities. Avoid new or crowded places, if possible. Buy clothes and shoes for the person that are easy to  put on and take off. Try to change the subject if the person becomes frustrated or angry. This is a great way to make things less tense. Tips to prevent injury  Keep floors clear. Remove rugs, magazine racks, and floor lamps. Keep hallways well-lit, especially at night. Put a handrail and nonslip mat in the bathtub or shower. Put childproof locks on cabinets that have dangerous items in them. These items include medicine, alcohol, guns, cleaning products, and sharp tools. Put locks on doors where the person can't see or  reach them. This helps keep the person from going out of the house and getting lost. Remove car keys and lock garage doors so the person doesn't try to drive. Be ready for emergencies. Keep a list of emergency phone numbers and addresses close by. Bracelets may be worn that track location and identify the person as having memory loss. This should be worn at all times for safety. Tips for the future  Discuss financial and legal planning early. Get help from a professional. People with this disease have trouble managing their money as the disease gets worse. Talk about advance directives, safety, and daily care. Take these steps: Create a living will and choose a power of attorney. This is someone who can make decisions for the person with Alzheimer's disease when they can no longer do so. Discuss driving safety and when to stop driving. The person's health care provider can help with this. If the person lives alone, make sure they're safe. Some people need extra help at home. Other people need more care at a nursing home or care center. How to recognize changes in the person's condition With this disease, memory problems and confusion slowly get worse. In time, the person may not know their friends and family members. The disease can cause changes in behavior and mood, such as anger or feeling worried or nervous. The person may also hallucinate. This means they see, hear, taste, smell, or feel things that aren't real. These changes can come on all of a sudden. They may happen in response to something such as: Pain. An infection. Changes in temperature. Too much stimulation or noise. Feeling lost or scared. Medicines. Where to find support Find out about services that can provide short-term care for the person. This is called respite care. It can allow you to take a break when you need one. Join a support group near you. These groups can help you: Learn ways to manage stress. Share experiences  with others. Get emotional comfort and support. Learn about caregiving as the disease gets worse. Find community resources. Where to find more information Alzheimer's Association: [employer] Contact a health care provider if: The person has a fever. The person has a sudden change in behavior that doesn't get better when you try to help calm them. The person has a sudden increase in confusion or new hallucinations. The person is not able to take care of themselves at home. You are no longer able to care for the person. Get help right away if: You feel like the person may hurt themselves or others. The person has talked about taking their own life. These symptoms may be an emergency. Take one of these steps right away: Go to your nearest emergency room. Call 911. Call the National Suicide Prevention Lifeline at 925 547 3874 or 988. Text the Crisis Text Line at 669-742-7183. This information is not intended to replace advice given to you by your health care provider. Make sure you discuss any  questions you have with your health care provider. Document Revised: 05/08/2023 Document Reviewed: 10/03/2022 Elsevier Patient Education  2024 [employer].

## 2024-01-26 NOTE — Addendum Note (Signed)
 Addended by: Neomia Banner on: 01/26/2024 10:29 AM   Modules accepted: Orders

## 2024-01-26 NOTE — Addendum Note (Signed)
 Addended by: Neomia Banner on: 01/26/2024 10:30 AM   Modules accepted: Orders

## 2024-02-13 ENCOUNTER — Encounter (HOSPITAL_COMMUNITY)

## 2024-02-19 ENCOUNTER — Ambulatory Visit (HOSPITAL_COMMUNITY)
Admission: RE | Admit: 2024-02-19 | Discharge: 2024-02-19 | Disposition: A | Discharge status: Home / Self Care | Source: Ambulatory Visit | Attending: Neurology | Admitting: Neurology

## 2024-02-19 ENCOUNTER — Ambulatory Visit: Payer: Self-pay | Admitting: Neurology

## 2024-02-19 DIAGNOSIS — G309 Alzheimer's disease, unspecified: Secondary | ICD-10-CM | POA: Insufficient documentation

## 2024-02-19 MED ORDER — FLORBETAPIR F 18 500-1900 MBQ/ML IV SOLN
10.0000 | Freq: Once | INTRAVENOUS | Status: AC
  Administered 2024-02-19: 9.988 via INTRAVENOUS

## 2024-03-08 ENCOUNTER — Ambulatory Visit: Payer: Medicare PPO | Admitting: Neurology

## 2024-04-09 DIAGNOSIS — K439 Ventral hernia without obstruction or gangrene: Secondary | ICD-10-CM | POA: Diagnosis not present

## 2024-04-09 DIAGNOSIS — Z08 Encounter for follow-up examination after completed treatment for malignant neoplasm: Secondary | ICD-10-CM | POA: Diagnosis not present

## 2024-04-09 DIAGNOSIS — Z86002 Personal history of in-situ neoplasm of other and unspecified genital organs: Secondary | ICD-10-CM | POA: Diagnosis not present

## 2024-04-09 DIAGNOSIS — Z09 Encounter for follow-up examination after completed treatment for conditions other than malignant neoplasm: Secondary | ICD-10-CM | POA: Diagnosis not present

## 2024-04-09 DIAGNOSIS — Z8507 Personal history of malignant neoplasm of pancreas: Secondary | ICD-10-CM | POA: Diagnosis not present

## 2024-04-09 DIAGNOSIS — R001 Bradycardia, unspecified: Secondary | ICD-10-CM | POA: Diagnosis not present

## 2024-04-09 DIAGNOSIS — D3A8 Other benign neuroendocrine tumors: Secondary | ICD-10-CM | POA: Diagnosis not present

## 2024-04-09 DIAGNOSIS — K909 Intestinal malabsorption, unspecified: Secondary | ICD-10-CM | POA: Diagnosis not present

## 2024-04-09 DIAGNOSIS — Z905 Acquired absence of kidney: Secondary | ICD-10-CM | POA: Diagnosis not present

## 2024-05-19 DIAGNOSIS — L570 Actinic keratosis: Secondary | ICD-10-CM | POA: Diagnosis not present

## 2024-05-19 DIAGNOSIS — Z85828 Personal history of other malignant neoplasm of skin: Secondary | ICD-10-CM | POA: Diagnosis not present

## 2024-05-19 DIAGNOSIS — L814 Other melanin hyperpigmentation: Secondary | ICD-10-CM | POA: Diagnosis not present

## 2024-05-19 DIAGNOSIS — D692 Other nonthrombocytopenic purpura: Secondary | ICD-10-CM | POA: Diagnosis not present

## 2024-05-19 DIAGNOSIS — D1801 Hemangioma of skin and subcutaneous tissue: Secondary | ICD-10-CM | POA: Diagnosis not present

## 2024-05-19 DIAGNOSIS — Z85820 Personal history of malignant melanoma of skin: Secondary | ICD-10-CM | POA: Diagnosis not present

## 2024-05-19 DIAGNOSIS — L821 Other seborrheic keratosis: Secondary | ICD-10-CM | POA: Diagnosis not present

## 2024-06-30 DIAGNOSIS — E1169 Type 2 diabetes mellitus with other specified complication: Secondary | ICD-10-CM | POA: Diagnosis not present

## 2024-07-05 DIAGNOSIS — E782 Mixed hyperlipidemia: Secondary | ICD-10-CM | POA: Diagnosis not present

## 2024-07-05 DIAGNOSIS — M810 Age-related osteoporosis without current pathological fracture: Secondary | ICD-10-CM | POA: Diagnosis not present

## 2024-07-05 DIAGNOSIS — I480 Paroxysmal atrial fibrillation: Secondary | ICD-10-CM | POA: Diagnosis not present

## 2024-07-05 DIAGNOSIS — E46 Unspecified protein-calorie malnutrition: Secondary | ICD-10-CM | POA: Diagnosis not present

## 2024-07-05 DIAGNOSIS — D6869 Other thrombophilia: Secondary | ICD-10-CM | POA: Diagnosis not present

## 2024-07-05 DIAGNOSIS — Z0000 Encounter for general adult medical examination without abnormal findings: Secondary | ICD-10-CM | POA: Diagnosis not present

## 2024-07-05 DIAGNOSIS — G309 Alzheimer's disease, unspecified: Secondary | ICD-10-CM | POA: Diagnosis not present

## 2024-07-05 DIAGNOSIS — E538 Deficiency of other specified B group vitamins: Secondary | ICD-10-CM | POA: Diagnosis not present

## 2024-07-05 DIAGNOSIS — E559 Vitamin D deficiency, unspecified: Secondary | ICD-10-CM | POA: Diagnosis not present

## 2024-07-07 ENCOUNTER — Other Ambulatory Visit (HOSPITAL_BASED_OUTPATIENT_CLINIC_OR_DEPARTMENT_OTHER): Payer: Self-pay | Admitting: Family Medicine

## 2024-07-07 DIAGNOSIS — M810 Age-related osteoporosis without current pathological fracture: Secondary | ICD-10-CM

## 2024-08-02 ENCOUNTER — Encounter: Payer: Self-pay | Admitting: Neurology

## 2024-08-02 ENCOUNTER — Ambulatory Visit: Admitting: Neurology

## 2024-08-02 VITALS — BP 141/81 | HR 79 | Ht 62.0 in | Wt 102.0 lb

## 2024-08-02 DIAGNOSIS — D3A8 Other benign neuroendocrine tumors: Secondary | ICD-10-CM

## 2024-08-02 DIAGNOSIS — R482 Apraxia: Secondary | ICD-10-CM | POA: Insufficient documentation

## 2024-08-02 DIAGNOSIS — I480 Paroxysmal atrial fibrillation: Secondary | ICD-10-CM | POA: Diagnosis not present

## 2024-08-02 DIAGNOSIS — F02B18 Dementia in other diseases classified elsewhere, moderate, with other behavioral disturbance: Secondary | ICD-10-CM | POA: Diagnosis not present

## 2024-08-02 MED ORDER — DONEPEZIL HCL 5 MG PO TABS: ORAL_TABLET | ORAL | 11 refills | Status: AC

## 2024-08-02 NOTE — Patient Instructions (Addendum)
 Aricept ; we are going to 5 mg bid po ( twice a day by mouth ).   Donepezil  Tablets What is this medication? DONEPEZIL  (doe NEP e zil) treats memory loss and confusion (dementia) in people who have Alzheimer disease. It works by improving attention, memory, and the ability to engage in daily activities. It is not a cure for dementia or Alzheimer disease. This medicine may be used for other purposes; ask your health care provider or pharmacist if you have questions. COMMON BRAND NAME(S): Aricept  What should I tell my care team before I take this medication? They need to know if you have any of these conditions: Head injury Heart disease Irregular heartbeat or rhythm Liver disease Lung or breathing disease, such as asthma Seizures Stomach ulcers, other stomach or intestine problems Stomach bleeding Trouble passing urine An unusual or allergic reaction to donepezil , other medications, foods, dyes, or preservatives Pregnant or trying to get pregnant Breastfeeding How should I use this medication? Take this medication by mouth with a glass of water . Follow the directions on the prescription label. You may take this medication with or without food. Take this medication at regular intervals. This medication is usually taken before bedtime. Do not take it more often than directed. Continue to take your medication even if you feel better. Do not stop taking except on your care team's advice. If you are taking the 23 mg donepezil  tablet, swallow it whole; do not cut, crush, or chew it. Talk to your care team about the use of this medication in children. Special care may be needed. Overdosage: If you think you have taken too much of this medicine contact a poison control center or emergency room at once. NOTE: This medicine is only for you. Do not share this medicine with others. What if I miss a dose? If you miss a dose, take it as soon as you can. If it is almost time for your next dose, take only  that dose, do not take double or extra doses. What may interact with this medication? Do not take this medication with any of the following: Certain medications for fungal infections, such as itraconazole, fluconazole, posaconazole, voriconazole Cisapride Dextromethorphan; quinidine Dronedarone Pimozide Quinidine Thioridazine This medication may also interact with the following: Antihistamines for allergy, cough, and cold Atropine Bethanechol Carbamazepine Certain medications for bladder problems, such as oxybutynin  or tolterodine Certain medications for Parkinson disease, such as benztropine or trihexyphenidyl Certain medications for stomach problems, such as dicyclomine or hyoscyamine Certain medications for travel sickness, such as scopolamine Dexamethasone  Dofetilide Ipratropium NSAIDs, medications for pain and inflammation, such as ibuprofen or naproxen Other medications for Alzheimer disease Other medications that cause heart rhythm changes Phenobarbital Phenytoin Rifampin, rifabutin, or rifapentine Ziprasidone This list may not describe all possible interactions. Give your health care provider a list of all the medicines, herbs, non-prescription drugs, or dietary supplements you use. Also tell them if you smoke, drink alcohol, or use illegal drugs. Some items may interact with your medicine. What should I watch for while using this medication? Visit your care team for regular checks on your progress. Tell your care team if your symptoms do not start to get better or if they get worse. This medication may affect your coordination, reaction time, or judgment. Do not drive or operate machinery until you know how this medication affects you. Sit up or stand slowly to reduce the risk of dizzy or fainting spells. Drinking alcohol with this medication can increase the risk of these  side effects. What side effects may I notice from receiving this medication? Side effects that you  should report to your care team as soon as possible: Allergic reactions--skin rash, itching, hives, swelling of the face, lips, tongue, or throat Peptic ulcer--burning stomach pain, loss of appetite, bloating, burping, heartburn, nausea, vomiting Seizures Slow heartbeat--dizziness, feeling faint or lightheaded, confusion, trouble breathing, unusual weakness or fatigue Stomach bleeding--bloody or black, tar-like stools, vomiting blood or brown material that looks like coffee grounds Trouble passing urine Side effects that usually do not require medical attention (report these to your care team if they continue or are bothersome): Diarrhea Fatigue Loss of appetite Muscle pain or cramps Nausea Trouble sleeping This list may not describe all possible side effects. Call your doctor for medical advice about side effects. You may report side effects to FDA at 1-800-FDA-1088. Where should I keep my medication? Keep out of reach of children. Store at room temperature between 15 and 30 degrees C (59 and 86 degrees F). Throw away any unused medication after the expiration date. NOTE: This sheet is a summary. It may not cover all possible information. If you have questions about this medicine, talk to your doctor, pharmacist, or health care provider.  2024 Elsevier/Gold Standard (2023-02-13 00:00:00)   Alzheimer's Disease Caregiver Guide Alzheimer's disease is a condition that makes a person: Forget things. Act differently. Have trouble paying attention and doing simple tasks. Have trouble talking and responding to your questions. These things get worse with time. The tips below can help you care for the person. How to help manage lifestyle changes Tips to help with symptoms Be calm and patient. Give simple, short answers to questions. Long answers can confuse the person. Avoid correcting the person in a negative way. Do not argue with the person. This may make the person more upset. Try not  to take things personally, even if the person forgets your name. Tips to lessen frustration Use simple words and a calm voice. Only give one direction at a time. Do daily tasks, like bathing and dressing, when the person is at their best. Also make any appointments for these times. Take your time. Simple tasks may take longer. Allow plenty of time to get tasks done. Limit choices for the person. Too many choices can be stressful. Involve the person in what you're doing. Keep things organized: Keep a daily routine. Organize medicines in a pillbox for each day of the week. Keep a calendar in a central place. Use it to remind the person of health care visits or other activities. Avoid new or crowded places, if possible. Buy clothes and shoes for the person that are easy to put on and take off. Try to change the subject if the person becomes frustrated or angry. This is a great way to make things less tense. Tips to prevent injury  Keep floors clear. Remove rugs, magazine racks, and floor lamps. Keep hallways well-lit, especially at night. Put a handrail and nonslip mat in the bathtub or shower. Put childproof locks on cabinets that have dangerous items in them. These items include medicine, alcohol, guns, cleaning products, and sharp tools. Put locks on doors where the person can't see or reach them. This helps keep the person from going out of the house and getting lost. Remove car keys and lock garage doors so the person doesn't try to drive. Be ready for emergencies. Keep a list of emergency phone numbers and addresses close by. Bracelets may be worn that track  location and identify the person as having memory loss. This should be worn at all times for safety. Tips for the future  Discuss financial and legal planning early. Get help from a professional. People with this disease have trouble managing their money as the disease gets worse. Talk about advance directives, safety, and daily  care. Take these steps: Create a living will and choose a power of attorney. This is someone who can make decisions for the person with Alzheimer's disease when they can no longer do so. Discuss driving safety and when to stop driving. The person's health care provider can help with this. If the person lives alone, make sure they're safe. Some people need extra help at home. Other people need more care at a nursing home or care center. How to recognize changes in the person's condition With this disease, memory problems and confusion slowly get worse. In time, the person may not know their friends and family members. The disease can cause changes in behavior and mood, such as anger or feeling worried or nervous. The person may also hallucinate. This means they see, hear, taste, smell, or feel things that aren't real. These changes can come on all of a sudden. They may happen in response to something such as: Pain. An infection. Changes in temperature. Too much stimulation or noise. Feeling lost or scared. Medicines. Where to find support Find out about services that can provide short-term care for the person. This is called respite care. It can allow you to take a break when you need one. Join a support group near you. These groups can help you: Learn ways to manage stress. Share experiences with others. Get emotional comfort and support. Learn about caregiving as the disease gets worse. Find community resources. Where to find more information Alzheimer's Association: westerntunes.it Contact a health care provider if: The person has a fever. The person has a sudden change in behavior that doesn't get better when you try to help calm them. The person has a sudden increase in confusion or new hallucinations. The person is not able to take care of themselves at home. You are no longer able to care for the person. Get help right away if: You feel like the person may hurt themselves or others. The  person has talked about taking their own life. These symptoms may be an emergency. Take one of these steps right away: Go to your nearest emergency room. Call 911. Call the National Suicide Prevention Lifeline at 229-077-5221 or 988. Text the Crisis Text Line at 416-129-0467. This information is not intended to replace advice given to you by your health care provider. Make sure you discuss any questions you have with your health care provider. Document Revised: 05/08/2023 Document Reviewed: 10/03/2022 Elsevier Patient Education  2024 [employer].

## 2024-08-02 NOTE — Progress Notes (Signed)
 Provider:  Dedra Gores, MD  Primary Care Physician:  Okey Carlin Redbird, MD [address] [place] [address]     Referring Provider: Okey Carlin Redbird, Md [address] [place],  [place] [address]          Chief Complaint according to patient   Patient presents with:                HISTORY OF PRESENT ILLNESS:  Jennifer Wall is a 78 y.o. female patient who is here for revisit 08/02/2024 for  Alzheimers Dementia at moderate degree. Refill and increase of medication.      Chief concern according to patient :  none , husband noted progression.      Jennifer Wall is a 78 y.o. female patient who is here for revisit 01/26/2024 for  AD positive bio markers, and moderate Neurocognitive  disorder,  MMSE 17.  She has Fuch's dystrophy, and has not needed an new glasses, [facility]. She has been losing interest in  reading but feels that's because eye strain.  There is a concern about the  visio-spatial  processing.   She had a whipple for pancreatic tumor ! She had a nephrectomy for kidney cancer !      Her higher glucose is a concern and that was recently addressed by Dr Okey.      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  Non smoker, moderate drinker.     Review of Systems: Out of a complete 14 system review, the patient complains of only the following symptoms, and all other reviewed systems are negative.:   SLEEPINESS ?  How likely are you to doze in the following situations: 0 = not likely, 1 = slight chance, 2 = moderate chance, 3 = high chance  Sitting and Reading? Watching Television? Sitting inactive in a public place (theater or meeting)? Lying down in the afternoon when circumstances permit? Sitting and talking to someone? Sitting quietly after lunch without alcohol? In a car, while stopped for a few minutes in traffic? As a passenger in a car for an hour without a break?  Total =         Social History   Socioeconomic History   Marital status: Married    Spouse name: Not on file   Number of children: 2   Years of education: Not on file   Highest education level: Not on file  Occupational History   Not on file  Tobacco Use   Smoking status: Never   Smokeless tobacco: Never  Vaping Use   Vaping status: Never Used  Substance and Sexual Activity   Alcohol use: Yes    Alcohol/week: 2.0 standard drinks of alcohol    Types: 2 Glasses of wine per week    Comment: occasional   Drug use: Not Currently   Sexual activity: Yes  Other Topics Concern   Not on file  Social History Narrative   Right handed   Wear glasses    1 cup coffee per day   Social Drivers of Health   Tobacco Use: Low Risk (08/02/2024)   Patient History    Smoking Tobacco Use: Never    Smokeless Tobacco Use: Never    Passive Exposure: Not on file  Financial Resource Strain: Not on file  Food Insecurity: Not on file  Transportation Needs: Not on file  Physical Activity: Not on file  Stress: Not on file  Social Connections: Not on file  Depression (EYV7-0): Not on file  Alcohol Screen: Not on file  Housing: Unknown (04/09/2024)   Received from [facility]   Epic    Unable to Pay for Housing in the Last Year: Not on file    Number of Times Moved in the Last Year: Not on file    At any time in the past 12 months, were you homeless or living in a shelter (including now)?: No  Utilities: Not on file  Health Literacy: Not on file    Family History  Problem Relation Age of Onset   Cancer Mother    Hypertension Father    Breast cancer Sister    Hypertension Sister     Past Medical History:  Diagnosis Date   Basal cell carcinoma 04/17/2015   Bilateral renal cysts    incidental finding on CT 06/ 2016   Diabetes mellitus type 2, noninsulin dependent (HCC) 09/06/2016   Duplicated ureter, right    per urologist-- dr ceil   Essential hypertension 03/23/2015    GERD (gastroesophageal reflux disease)    mild- controls with diet   Hiatal hernia    History of acute pancreatitis    01-14-2006  ([facility])  idiopathic-  resolved   History of atrial fibrillation without current medication currently 09-18-2015 pt denies S & S   post op 04-14-2015 whipple procedure (per pt had work-up done by [facility] cardiologist- pt was released from cardiology care -- no meds or asa)   History of basal cell carcinoma excision    multiple skin removal   History of colon polyps    hyperplastic   History of malignant neuroendocrine neoplasm followed by oncologist-- dr maree at [facility]--- per last note 09-19-2016 no recurrence   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  s/p  whipple (T3 N0 M0, Grade 1) negative margins and nodes   History of melanoma excision    1989 left thigh   History of pancreatic surgery    08/ 2016  s/p  whipple for neuroendocrine neoplasm tumor   History of renal cell cancer UROLOGIST-  DR OTTELIN   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)   s/p  fulgeration 03/ 2017, 10/ 2017 (no radiation or chemo)   History of transitional cell carcinoma of kidney 09/06/2016   Hyperlipidemia    Hypertension    Melanoma (HCC) 04/17/2015   Postoperative atrial fibrillation (HCC) 05/22/2015   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   Supraventricular tachycardia    diagnosed at [facility] by monitor 2017   Supraventricular tachycardia, paroxysmal 06/12/2015   Type 2 diabetes mellitus (HCC)    Ureteral cancer, 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   Ventral hernia     Past Surgical History:  Procedure Laterality Date   CATARACT EXTRACTION W/ INTRAOCULAR LENS  IMPLANT, BILATERAL  2010   COLONOSCOPY  last one 2015   CYSTOSCOPY WITH RETROGRADE PYELOGRAM, URETEROSCOPY AND STENT PLACEMENT Right 09/22/2015   Procedure: CYSTOSCOPY WITH RIGHT RETROGRADE PYELOGRAM, URETEROSCOPY AND STENT PLACEMENT, BIOPSY;  Surgeon: Mark Ottelin, MD;   Location: [facility];  Service: Urology;  Laterality: Right;   CYSTOSCOPY WITH RETROGRADE PYELOGRAM, URETEROSCOPY AND STENT PLACEMENT Right 11/13/2015   Procedure: CYSTOSCOPY WITH RIGHT RETROGRADE PYELOGRAM, URETEROSCOPY BIOPSY, FULGURATION  AND STENT ;  Surgeon: Mark Ottelin, MD;  Location[facility] ;  Service: Urology;  Laterality: Right;   CYSTOSCOPY WITH URETEROSCOPY AND STENT PLACEMENT Right 06/17/2016  Procedure: CYSTOSCOPY , URETEROSCOPY  WITH  FULGUTATION;  Surgeon: Mark Ottelin, MD;  Location: [facility];  Service: Urology;  Laterality: Right;   CYSTOSCOPY/RETROGRADE/URETEROSCOPY Right 04/14/2017   Procedure: CYSTOSCOPY/RETROGRADE/URETEROSCOPY/ FULGURATION OF UPPER POLE TUMOR;  Surgeon: Ottelin, Mark, MD;  Location: [facility];  Service: Urology;  Laterality: Right;   DIAGNOSTIC LAPAROSCOPY  04/14/2015   per office note from [facility] 07/2015   EUS N/A 02/22/2015   Procedure: UPPER ENDOSCOPIC ULTRASOUND (EUS) RADIAL;  Surgeon: Elsie Cree, MD;  Location: [facility];  Service: Endoscopy;  Laterality: N/A;   KIDNEY SURGERY Right    right kindey removed   LUMBAR DISC SURGERY  2000   L4 - L5   MELANOMA EXCISION  1989   left thigh   PANCREATICODUODENECTOMY  04-14-2015   at [facility]   malignant neuroendocrine pancreatic tumor   PULLEY RELEASE RIGHT THUMB  02-01-2005     Medications Ordered Prior to Encounter[1]  Allergies[2]   DIAGNOSTIC DATA (LABS, IMAGING, TESTING) - I reviewed patient records, labs, notes, testing and imaging myself where available.  Lab Results  Component Value Date   WBC 5.0 09/08/2023   HGB 13.1 09/08/2023   HCT 39.8 09/08/2023   MCV 92 09/08/2023   PLT 165 09/08/2023      Component Value Date/Time   NA 138 09/08/2023 1532   K 4.4 09/08/2023 1532   CL 103 09/08/2023 1532   CO2 22 09/08/2023 1532   GLUCOSE 272 (H) 09/08/2023 1532   GLUCOSE 193 (H) 04/14/2017 1002   BUN 17 09/08/2023 1532    CREATININE 1.12 (H) 09/08/2023 1532   CALCIUM 9.3 09/08/2023 1532   PROT 6.4 09/08/2023 1532   ALBUMIN 4.2 09/08/2023 1532   AST 20 09/08/2023 1532   ALT 15 09/08/2023 1532   ALKPHOS 64 09/08/2023 1532   BILITOT 0.6 09/08/2023 1532   GFRNONAA 60 (L) 10/30/2016 0049   GFRAA >60 10/30/2016 0049   No results found for: CHOL, HDL, LDLCALC, LDLDIRECT, TRIG, CHOLHDL Lab Results  Component Value Date   HGBA1C 8.6 (H) 09/08/2023   Lab Results  Component Value Date   VITAMINB12 770 09/08/2023   Lab Results  Component Value Date   TSH 1.810 09/08/2023    PHYSICAL EXAM:  Vitals:   08/02/24 1041  BP: (!) 141/81  Pulse: 79   No data found. Body mass index is 18.66 kg/m.   Wt Readings from Last 3 Encounters:  08/02/24 102 lb (46.3 kg)  01/26/24 101 lb (45.8 kg)  09/08/23 104 lb 9.6 oz (47.4 kg)     Ht Readings from Last 3 Encounters:  08/02/24 5' 2 (1.575 m)  01/26/24 5' 2 (1.575 m)  09/08/23 5' 2 (1.575 m)      General: The patient is awake, alert and appears not in acute distress and groomed. Head: Normocephalic, atraumatic.  Neck is supple.Cardiovascular:  Regular rate and cardiac rhythm by pulse, without distended neck veins. Respiratory: no shortness of breath  Skin:  Without evidence of ankle edema, or rash. Trunk: BMI is  18.7    NEUROLOGIC EXAM: The patient is awake and alert, oriented to place and time.   Memory subjective described as intact.  Attention span & concentration ability appears normal.   Speech is fluent,  without  dysarthria, dysphonia or aphasia.  Mood and affect are appropriate.   Neurological Examination: Mental Status: Intact. Language intact  and speech is hesitant , repetitive.    Cognitive : mini-mental status exam  08/02/2024   10:42 AM 01/26/2024    9:35 AM 09/08/2023    2:27 PM  MMSE - Mini Mental State Exam  Orientation to time 3 1 2   Orientation to Place 4 4 4   Registration 3 3 3   Attention/ Calculation 0  0 1  Recall 1 1 3   Language- name 2 objects 2 2 2   Language- repeat 1 1 1   Language- follow 3 step command 2 3 3   Language- read & follow direction 1 1 1   Write a sentence 1 1 1   Copy design 0 0 0  Total score 18 17 21        Cranial nerves: Pupils are equal and briskly reactive to light. Funduscopic exam without  evidence of pallor or edema. Extraocular movements  in vertical and horizontal planes intact and without nystagmus. Visual fields by finger perimetry are intact. Hearing to finger rub intact.  Facial sensation intact to fine touch. Facial motor strength is symmetric and tongue and uvula move midline.   Motor exam:  Coarsely elevated tone and reduced  muscle bulk and symmetric normal strength in all extremities. Grip Strength equally reduced    Proximal strength of shoulder muscles and hip flexors was reduced. Muscle mass is low.   Gait and Station: Cautious -Normal for vision loss. .    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 There has been a progression on apraxia and inability to follow multi step proceedings.   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   Component Ref Range & Units (hover) 10 mo ago  A -- Beta-amyloid 42/40 Ratio 0.101 Low   Beta-amyloid 42 25.19  Beta-amyloid 40 249.58  T -- [id number] 2.58 High   N -- NfL, Plasma 6.44  ATN SUMMARY Comment  Comment:                        A+ T+ N- A low beta-amyloid 42/40 and a high pTau181 concentration were observed. A normal NfL concentration was observed at this time. These results are consistent with the presence of Alzheimer's related pathology.   Genetic E3/ E3.   Brain amyloid PET scan was positive for moderate to frequent beta amyloid plaques in the brain, this is consistent with Alzheimer's pathology.   The  patient is no longer driving, and has not been for over a  year:  Has been less able to read, and has some word finding affecting speech and writing.       Plan : 1) AD with progressive memory loss, / we try to increase Aricept  5 mg to 5 mg bid. This is more GI friendly and not as likely to cause dizziness.   The moderate degree of Dementia will preclude use of anti-amyloid therapy.   2) she has borderline bradycardia , will see cardiology in January.  Dr Lonni Cash, MD   3) RV in 12 months  for MMSE if desired. There is no currant additional therapy available.     I would like to thank Okey Carlin Redbird, MD  for allowing me to meet with this pleasant patient.     The referring provider will be notified of the test results.   The patient's condition requires frequent monitoring and adjustments in the treatment plan, reflecting the ongoing  complexity of care.  This provider is the continuing focal point for all needed services for this condition.  After spending a total time of  40  minutes face to face and time for  history taking, physical and neurologic examination, review of laboratory studies,  personal review of imaging studies, reports and results of other testing and review of referral information / records as far as provided in visit,   Electronically signed by: Dedra Gores, MD 08/02/2024 10:57 AM  [facility] Board certified by The [employer] of Sleep Medicine and Diplomate of the [employer] of [employer] Medicine. Board certified In Neurology through the ABPN, Fellow of the [employer] of Neu[employer].      [1]  Current Outpatient Medications on File Prior to Visit  Medication Sig Dispense Refill   acetaminophen  (TYLENOL ) 500 MG tablet Take 1,000 mg by mouth every 6 (six) hours as needed for moderate pain or headache.     alendronate (FOSAMAX) 70 MG tablet Take 1 mg by mouth once a week.     apixaban   (ELIQUIS ) 5 MG TABS tablet Take 1 tablet (5 mg total) by mouth 2 (two) times daily. 180 tablet 1   citalopram (CELEXA) 20 MG tablet Take 20 mg by mouth every morning.     donepezil  (ARICEPT ) 5 MG tablet Take 1 tablet (5 mg total) by mouth at bedtime. 30 tablet 5   hydrochlorothiazide (MICROZIDE) 12.5 MG capsule Take 12.5 mg by mouth daily.     Lecanemab -irmb (LEQEMBI ) 200 MG/2ML SOLN print 2 mL 5   lisinopril  (ZESTRIL ) 10 MG tablet Take 20 mg by mouth daily.     memantine  (NAMENDA ) 10 MG tablet Take 1 tablet (10 mg total) by mouth 2 (two) times daily. 180 tablet 1   metoprolol  succinate (TOPROL  XL) 50 MG 24 hr tablet Take 0.5 tablets (25 mg total) by mouth daily. Take with or immediately following a meal. 45 tablet 3   pantoprazole (PROTONIX) 40 MG tablet Take 40 mg by mouth daily.     repaglinide (PRANDIN) 2 MG tablet Take 1 mg by mouth 2 (two) times daily before a meal.     simvastatin (ZOCOR) 40 MG tablet Take 40 mg by mouth at bedtime.     triamcinolone (NASACORT) 55 MCG/ACT AERO nasal inhaler Place 1 spray into the nose daily as needed.     No current facility-administered medications on file prior to visit.  [2]  Allergies Allergen Reactions   Nitrofurantoin Other (See Comments)    diarrhea   Fenofibrate Micronized Other (See Comments)    Leg pain.    Metformin Other (See Comments)    Stomach pain, pancreatitis   Metformin And Related Other (See Comments)    Stomach pain   Tricor [Fenofibrate] Other (See Comments)    Leg pain.    Erythromycin Rash    + turn red

## 2024-09-13 ENCOUNTER — Ambulatory Visit: Admitting: Emergency Medicine

## 2024-10-14 ENCOUNTER — Ambulatory Visit: Admitting: Cardiology

## 2025-08-03 ENCOUNTER — Ambulatory Visit: Admitting: Neurology
